# Patient Record
Sex: Female | Born: 1965 | Race: Black or African American | Hispanic: No | Marital: Married | Smoking: Former smoker
Health system: Southern US, Community
[De-identification: ages and names within clinical notes are randomized; demographics above are authoritative.]

## PROBLEM LIST (undated history)

## (undated) ENCOUNTER — Ambulatory Visit (HOSPITAL_COMMUNITY): Admission: EM | Payer: 59 | Source: Home / Self Care

## (undated) DIAGNOSIS — K219 Gastro-esophageal reflux disease without esophagitis: Secondary | ICD-10-CM

## (undated) DIAGNOSIS — E559 Vitamin D deficiency, unspecified: Secondary | ICD-10-CM

## (undated) DIAGNOSIS — U071 COVID-19: Secondary | ICD-10-CM

## (undated) DIAGNOSIS — M5431 Sciatica, right side: Secondary | ICD-10-CM

## (undated) DIAGNOSIS — M5030 Other cervical disc degeneration, unspecified cervical region: Secondary | ICD-10-CM

## (undated) DIAGNOSIS — I10 Essential (primary) hypertension: Secondary | ICD-10-CM

## (undated) DIAGNOSIS — Z789 Other specified health status: Secondary | ICD-10-CM

## (undated) DIAGNOSIS — R7303 Prediabetes: Secondary | ICD-10-CM

## (undated) DIAGNOSIS — E7800 Pure hypercholesterolemia, unspecified: Secondary | ICD-10-CM

## (undated) DIAGNOSIS — D126 Benign neoplasm of colon, unspecified: Secondary | ICD-10-CM

## (undated) HISTORY — DX: COVID-19: U07.1

## (undated) HISTORY — DX: Benign neoplasm of colon, unspecified: D12.6

## (undated) HISTORY — DX: Sciatica, right side: M54.31

## (undated) HISTORY — DX: Vitamin D deficiency, unspecified: E55.9

## (undated) HISTORY — DX: Other cervical disc degeneration, unspecified cervical region: M50.30

## (undated) HISTORY — DX: Gastro-esophageal reflux disease without esophagitis: K21.9

## (undated) HISTORY — DX: Prediabetes: R73.03

## (undated) HISTORY — DX: Essential (primary) hypertension: I10

---

## 1999-04-20 ENCOUNTER — Other Ambulatory Visit: Admission: RE | Admit: 1999-04-20 | Discharge: 1999-04-20 | Payer: Self-pay | Admitting: *Deleted

## 2003-11-19 ENCOUNTER — Ambulatory Visit (HOSPITAL_COMMUNITY): Admission: RE | Admit: 2003-11-19 | Discharge: 2003-11-19 | Payer: Self-pay | Admitting: Family Medicine

## 2005-06-26 ENCOUNTER — Encounter: Admission: RE | Admit: 2005-06-26 | Discharge: 2005-06-26 | Payer: Self-pay

## 2006-11-22 ENCOUNTER — Ambulatory Visit (HOSPITAL_COMMUNITY): Admission: RE | Admit: 2006-11-22 | Discharge: 2006-11-22 | Payer: Self-pay | Admitting: Obstetrics

## 2007-02-27 ENCOUNTER — Emergency Department (HOSPITAL_COMMUNITY): Admission: EM | Admit: 2007-02-27 | Discharge: 2007-02-27 | Payer: Self-pay | Admitting: Family Medicine

## 2007-03-26 ENCOUNTER — Emergency Department (HOSPITAL_COMMUNITY): Admission: EM | Admit: 2007-03-26 | Discharge: 2007-03-26 | Payer: Self-pay | Admitting: Emergency Medicine

## 2008-04-30 ENCOUNTER — Emergency Department (HOSPITAL_COMMUNITY): Admission: EM | Admit: 2008-04-30 | Discharge: 2008-04-30 | Payer: Self-pay | Admitting: Family Medicine

## 2008-08-02 ENCOUNTER — Ambulatory Visit (HOSPITAL_COMMUNITY): Admission: RE | Admit: 2008-08-02 | Discharge: 2008-08-02 | Payer: Self-pay | Admitting: Obstetrics & Gynecology

## 2008-11-02 ENCOUNTER — Emergency Department (HOSPITAL_COMMUNITY): Admission: EM | Admit: 2008-11-02 | Discharge: 2008-11-02 | Payer: Self-pay | Admitting: Family Medicine

## 2009-11-13 ENCOUNTER — Ambulatory Visit (HOSPITAL_COMMUNITY): Admission: RE | Admit: 2009-11-13 | Discharge: 2009-11-13 | Payer: Self-pay | Admitting: Obstetrics & Gynecology

## 2011-01-12 LAB — POCT URINALYSIS DIP (DEVICE)
Glucose, UA: NEGATIVE mg/dL
Hgb urine dipstick: NEGATIVE
Ketones, ur: NEGATIVE mg/dL
Specific Gravity, Urine: 1.02 (ref 1.005–1.030)
Urobilinogen, UA: 0.2 mg/dL (ref 0.0–1.0)

## 2011-01-12 LAB — POCT PREGNANCY, URINE: Preg Test, Ur: NEGATIVE

## 2011-12-29 ENCOUNTER — Other Ambulatory Visit: Payer: Self-pay | Admitting: Obstetrics & Gynecology

## 2011-12-29 DIAGNOSIS — Z1231 Encounter for screening mammogram for malignant neoplasm of breast: Secondary | ICD-10-CM

## 2012-01-24 ENCOUNTER — Ambulatory Visit (HOSPITAL_COMMUNITY)
Admission: RE | Admit: 2012-01-24 | Discharge: 2012-01-24 | Disposition: A | Discharge status: Home / Self Care | Payer: 59 | Source: Ambulatory Visit | Attending: Obstetrics & Gynecology | Admitting: Obstetrics & Gynecology

## 2012-01-24 DIAGNOSIS — Z1231 Encounter for screening mammogram for malignant neoplasm of breast: Secondary | ICD-10-CM

## 2012-06-29 ENCOUNTER — Ambulatory Visit: Payer: 59 | Admitting: Internal Medicine

## 2012-08-14 ENCOUNTER — Ambulatory Visit: Payer: 59 | Admitting: Internal Medicine

## 2012-08-14 DIAGNOSIS — Z0289 Encounter for other administrative examinations: Secondary | ICD-10-CM

## 2012-09-12 ENCOUNTER — Other Ambulatory Visit: Payer: Self-pay | Admitting: Neurosurgery

## 2012-09-14 ENCOUNTER — Encounter (HOSPITAL_COMMUNITY): Payer: Self-pay

## 2012-09-14 ENCOUNTER — Encounter (HOSPITAL_COMMUNITY)
Admission: RE | Admit: 2012-09-14 | Discharge: 2012-09-14 | Disposition: A | Discharge status: Home / Self Care | Payer: 59 | Source: Ambulatory Visit | Attending: Neurosurgery | Admitting: Neurosurgery

## 2012-09-14 HISTORY — DX: Other specified health status: Z78.9

## 2012-09-14 HISTORY — DX: Pure hypercholesterolemia, unspecified: E78.00

## 2012-09-14 LAB — CBC
HCT: 37.3 % (ref 36.0–46.0)
Hemoglobin: 12 g/dL (ref 12.0–15.0)
MCH: 25.3 pg — ABNORMAL LOW (ref 26.0–34.0)
MCV: 78.7 fL (ref 78.0–100.0)
Platelets: 191 10*3/uL (ref 150–400)
RBC: 4.74 MIL/uL (ref 3.87–5.11)
WBC: 4.6 10*3/uL (ref 4.0–10.5)

## 2012-09-14 LAB — SURGICAL PCR SCREEN: Staphylococcus aureus: NEGATIVE

## 2012-09-14 NOTE — Pre-Procedure Instructions (Signed)
20 Cassandra Osborne  09/14/2012   Your procedure is scheduled on:  Monday, December 23  Report to [facility] on the 3rd floor at 1030 AM.  Call this number if you have problems the morning of surgery: [phone]   Remember:   Do not eat food or drink liquids:After Midnight.Sunday night    Take these medicines the morning of surgery with A SIP OF WATER: pain medication  if needed   Do not wear jewelry, make-up or nail polish.  Do not wear lotions, powders, or perfumes. You may wear deodorant.  Do not shave 48 hours prior to surgery.   Do not bring valuables to the hospital.  Contacts, dentures or bridgework may not be worn into surgery.  Leave suitcase in the car. After surgery it may be brought to your room.  For patients admitted to the hospital, checkout time is 11:00 AM the day of discharge.       Special Instructions: Shower using CHG 2 nights before surgery and the night before surgery.  If you shower the day of surgery use CHG.  Use special wash - you have one bottle of CHG for all showers.  You should use approximately 1/3 of the bottle for each shower.   Please read over the following fact sheets that you were given: Pain Booklet, Coughing and Deep Breathing, MRSA Information and Surgical Site Infection Prevention

## 2012-09-17 MED ORDER — CEFAZOLIN SODIUM-DEXTROSE 2-3 GM-% IV SOLR
2.0000 g | INTRAVENOUS | Status: AC
  Administered 2012-09-18: 2 g via INTRAVENOUS
  Filled 2012-09-17: qty 50

## 2012-09-18 ENCOUNTER — Ambulatory Visit (HOSPITAL_COMMUNITY): Payer: 59 | Admitting: Anesthesiology

## 2012-09-18 ENCOUNTER — Encounter (HOSPITAL_COMMUNITY): Payer: Self-pay | Admitting: *Deleted

## 2012-09-18 ENCOUNTER — Encounter (HOSPITAL_COMMUNITY): Payer: Self-pay | Admitting: Anesthesiology

## 2012-09-18 ENCOUNTER — Ambulatory Visit (HOSPITAL_COMMUNITY): Payer: 59

## 2012-09-18 ENCOUNTER — Ambulatory Visit (HOSPITAL_COMMUNITY)
Admission: RE | Admit: 2012-09-18 | Discharge: 2012-09-19 | Disposition: A | Discharge status: Home / Self Care | Payer: 59 | Source: Ambulatory Visit | Attending: Neurosurgery | Admitting: Neurosurgery

## 2012-09-18 ENCOUNTER — Encounter (HOSPITAL_COMMUNITY)
Admission: RE | Disposition: A | Discharge status: Home / Self Care | Payer: Self-pay | Source: Ambulatory Visit | Attending: Neurosurgery

## 2012-09-18 DIAGNOSIS — Z01812 Encounter for preprocedural laboratory examination: Secondary | ICD-10-CM | POA: Insufficient documentation

## 2012-09-18 DIAGNOSIS — M5126 Other intervertebral disc displacement, lumbar region: Secondary | ICD-10-CM | POA: Insufficient documentation

## 2012-09-18 DIAGNOSIS — F17200 Nicotine dependence, unspecified, uncomplicated: Secondary | ICD-10-CM | POA: Insufficient documentation

## 2012-09-18 HISTORY — PX: LUMBAR LAMINECTOMY/DECOMPRESSION MICRODISCECTOMY: SHX5026

## 2012-09-18 SURGERY — LUMBAR LAMINECTOMY/DECOMPRESSION MICRODISCECTOMY 1 LEVEL
Anesthesia: General | Site: Back | Laterality: Left | Wound class: Clean

## 2012-09-18 MED ORDER — PROPOFOL 10 MG/ML IV BOLUS
INTRAVENOUS | Status: DC | PRN
  Administered 2012-09-18: 180 mg via INTRAVENOUS

## 2012-09-18 MED ORDER — THROMBIN 5000 UNITS EX SOLR: CUTANEOUS | Status: DC | PRN

## 2012-09-18 MED ORDER — MORPHINE SULFATE 2 MG/ML IJ SOLN
1.0000 mg | INTRAMUSCULAR | Status: DC | PRN
  Administered 2012-09-18 – 2012-09-19 (×2): 2 mg via INTRAVENOUS
  Filled 2012-09-18 (×2): qty 1

## 2012-09-18 MED ORDER — OXYCODONE HCL 5 MG PO TABS
5.0000 mg | ORAL_TABLET | ORAL | Status: DC | PRN
  Administered 2012-09-18: 10 mg via ORAL
  Administered 2012-09-19: 5 mg via ORAL
  Filled 2012-09-18: qty 1
  Filled 2012-09-18: qty 2

## 2012-09-18 MED ORDER — ONDANSETRON HCL 4 MG/2ML IJ SOLN
INTRAMUSCULAR | Status: DC | PRN
  Administered 2012-09-18: 4 mg via INTRAVENOUS

## 2012-09-18 MED ORDER — MIDAZOLAM HCL 5 MG/5ML IJ SOLN
INTRAMUSCULAR | Status: DC | PRN
  Administered 2012-09-18: 2 mg via INTRAVENOUS

## 2012-09-18 MED ORDER — THROMBIN 5000 UNITS EX KIT
PACK | CUTANEOUS | Status: DC | PRN
  Administered 2012-09-18 (×2): 5000 [IU] via TOPICAL

## 2012-09-18 MED ORDER — HEMOSTATIC AGENTS (NO CHARGE) OPTIME
TOPICAL | Status: DC | PRN
  Administered 2012-09-18: 1 via TOPICAL

## 2012-09-18 MED ORDER — ONDANSETRON HCL 4 MG/2ML IJ SOLN: 4.0000 mg | INTRAMUSCULAR | Status: DC | PRN

## 2012-09-18 MED ORDER — KETOROLAC TROMETHAMINE 30 MG/ML IJ SOLN
INTRAMUSCULAR | Status: AC
  Administered 2012-09-18: 30 mg
  Filled 2012-09-18: qty 1

## 2012-09-18 MED ORDER — SODIUM CHLORIDE 0.9 % IJ SOLN: 3.0000 mL | INTRAMUSCULAR | Status: DC | PRN

## 2012-09-18 MED ORDER — KETOROLAC TROMETHAMINE 30 MG/ML IJ SOLN
30.0000 mg | Freq: Four times a day (QID) | INTRAMUSCULAR | Status: DC
  Administered 2012-09-18 – 2012-09-19 (×2): 30 mg via INTRAVENOUS
  Filled 2012-09-18 (×6): qty 1

## 2012-09-18 MED ORDER — ACETAMINOPHEN 10 MG/ML IV SOLN
1000.0000 mg | Freq: Four times a day (QID) | INTRAVENOUS | Status: DC
  Administered 2012-09-18 – 2012-09-19 (×3): 1000 mg via INTRAVENOUS
  Filled 2012-09-18 (×4): qty 100

## 2012-09-18 MED ORDER — CYCLOBENZAPRINE HCL 10 MG PO TABS: 10.0000 mg | ORAL_TABLET | Freq: Three times a day (TID) | ORAL | Status: DC | PRN

## 2012-09-18 MED ORDER — SODIUM CHLORIDE 0.9 % IJ SOLN
3.0000 mL | Freq: Two times a day (BID) | INTRAMUSCULAR | Status: DC
  Administered 2012-09-18: 3 mL via INTRAVENOUS

## 2012-09-18 MED ORDER — MENTHOL 3 MG MT LOZG
1.0000 | LOZENGE | OROMUCOSAL | Status: DC | PRN
  Administered 2012-09-19: 3 mg via ORAL
  Filled 2012-09-18: qty 9

## 2012-09-18 MED ORDER — OXYCODONE HCL 5 MG/5ML PO SOLN: 5.0000 mg | Freq: Once | ORAL | Status: DC | PRN

## 2012-09-18 MED ORDER — FENTANYL CITRATE 0.05 MG/ML IJ SOLN
INTRAMUSCULAR | Status: DC | PRN
  Administered 2012-09-18 (×2): 50 ug via INTRAVENOUS
  Administered 2012-09-18: 100 ug via INTRAVENOUS
  Administered 2012-09-18: 50 ug via INTRAVENOUS

## 2012-09-18 MED ORDER — OXYCODONE HCL 5 MG PO TABS: 5.0000 mg | ORAL_TABLET | Freq: Once | ORAL | Status: DC | PRN

## 2012-09-18 MED ORDER — LIDOCAINE-EPINEPHRINE 0.5 %-1:200000 IJ SOLN
INTRAMUSCULAR | Status: DC | PRN
  Administered 2012-09-18: 10 mL

## 2012-09-18 MED ORDER — LACTATED RINGERS IV SOLN
INTRAVENOUS | Status: DC | PRN
  Administered 2012-09-18 (×2): via INTRAVENOUS

## 2012-09-18 MED ORDER — NEOSTIGMINE METHYLSULFATE 1 MG/ML IJ SOLN
INTRAMUSCULAR | Status: DC | PRN
  Administered 2012-09-18: 4 mg via INTRAVENOUS

## 2012-09-18 MED ORDER — HYDROMORPHONE HCL PF 1 MG/ML IJ SOLN
INTRAMUSCULAR | Status: AC
  Filled 2012-09-18: qty 1

## 2012-09-18 MED ORDER — POTASSIUM CHLORIDE IN NACL 20-0.9 MEQ/L-% IV SOLN
INTRAVENOUS | Status: DC
  Filled 2012-09-18 (×3): qty 1000

## 2012-09-18 MED ORDER — GLYCOPYRROLATE 0.2 MG/ML IJ SOLN
INTRAMUSCULAR | Status: DC | PRN
  Administered 2012-09-18: .6 mg via INTRAVENOUS

## 2012-09-18 MED ORDER — ROCURONIUM BROMIDE 100 MG/10ML IV SOLN
INTRAVENOUS | Status: DC | PRN
  Administered 2012-09-18: 50 mg via INTRAVENOUS

## 2012-09-18 MED ORDER — ONDANSETRON HCL 4 MG/2ML IJ SOLN: 4.0000 mg | Freq: Four times a day (QID) | INTRAMUSCULAR | Status: DC | PRN

## 2012-09-18 MED ORDER — HYDROMORPHONE HCL PF 1 MG/ML IJ SOLN
0.2500 mg | INTRAMUSCULAR | Status: DC | PRN
  Administered 2012-09-18 (×4): 0.5 mg via INTRAVENOUS

## 2012-09-18 MED ORDER — VITAMIN D (ERGOCALCIFEROL) 1.25 MG (50000 UNIT) PO CAPS
50000.0000 [IU] | ORAL_CAPSULE | ORAL | Status: DC
  Filled 2012-09-18: qty 1

## 2012-09-18 MED ORDER — LIDOCAINE HCL (CARDIAC) 20 MG/ML IV SOLN
INTRAVENOUS | Status: DC | PRN
  Administered 2012-09-18: 80 mg via INTRAVENOUS

## 2012-09-18 MED ORDER — PHENOL 1.4 % MT LIQD: 1.0000 | OROMUCOSAL | Status: DC | PRN

## 2012-09-18 MED ORDER — ARTIFICIAL TEARS OP OINT
TOPICAL_OINTMENT | OPHTHALMIC | Status: DC | PRN
  Administered 2012-09-18: 1 via OPHTHALMIC

## 2012-09-18 SURGICAL SUPPLY — 54 items
BAG DECANTER FOR FLEXI CONT (MISCELLANEOUS) ×2 IMPLANT
BENZOIN TINCTURE PRP APPL 2/3 (GAUZE/BANDAGES/DRESSINGS) IMPLANT
BLADE SURG ROTATE 9660 (MISCELLANEOUS) IMPLANT
BUR MATCHSTICK NEURO 3.0 LAGG (BURR) ×2 IMPLANT
CANISTER SUCTION 2500CC (MISCELLANEOUS) ×2 IMPLANT
CLOTH BEACON ORANGE TIMEOUT ST (SAFETY) ×2 IMPLANT
CONT SPEC 4OZ CLIKSEAL STRL BL (MISCELLANEOUS) ×2 IMPLANT
DECANTER SPIKE VIAL GLASS SM (MISCELLANEOUS) ×2 IMPLANT
DERMABOND ADVANCED (GAUZE/BANDAGES/DRESSINGS) ×1
DERMABOND ADVANCED .7 DNX12 (GAUZE/BANDAGES/DRESSINGS) ×1 IMPLANT
DRAPE LAPAROTOMY 100X72X124 (DRAPES) ×2 IMPLANT
DRAPE MICROSCOPE LEICA (MISCELLANEOUS) ×2 IMPLANT
DRAPE POUCH INSTRU U-SHP 10X18 (DRAPES) ×2 IMPLANT
DRAPE SURG 17X23 STRL (DRAPES) ×2 IMPLANT
DURAPREP 26ML APPLICATOR (WOUND CARE) ×2 IMPLANT
ELECT REM PT RETURN 9FT ADLT (ELECTROSURGICAL) ×2
ELECTRODE REM PT RTRN 9FT ADLT (ELECTROSURGICAL) ×1 IMPLANT
GAUZE SPONGE 4X4 16PLY XRAY LF (GAUZE/BANDAGES/DRESSINGS) IMPLANT
GLOVE BIO SURGEON STRL SZ 6.5 (GLOVE) ×2 IMPLANT
GLOVE BIO SURGEON STRL SZ8 (GLOVE) ×2 IMPLANT
GLOVE BIOGEL PI IND STRL 7.0 (GLOVE) ×1 IMPLANT
GLOVE BIOGEL PI IND STRL 8.5 (GLOVE) ×1 IMPLANT
GLOVE BIOGEL PI INDICATOR 7.0 (GLOVE) ×1
GLOVE BIOGEL PI INDICATOR 8.5 (GLOVE) ×1
GLOVE ECLIPSE 6.5 STRL STRAW (GLOVE) ×2 IMPLANT
GLOVE ECLIPSE 7.5 STRL STRAW (GLOVE) ×6 IMPLANT
GLOVE EXAM NITRILE LRG STRL (GLOVE) IMPLANT
GLOVE EXAM NITRILE MD LF STRL (GLOVE) IMPLANT
GLOVE EXAM NITRILE XL STR (GLOVE) IMPLANT
GLOVE EXAM NITRILE XS STR PU (GLOVE) IMPLANT
GLOVE INDICATOR 8.0 STRL GRN (GLOVE) ×2 IMPLANT
GOWN BRE IMP SLV AUR LG STRL (GOWN DISPOSABLE) ×4 IMPLANT
GOWN BRE IMP SLV AUR XL STRL (GOWN DISPOSABLE) IMPLANT
GOWN STRL REIN 2XL LVL4 (GOWN DISPOSABLE) ×2 IMPLANT
KIT BASIN OR (CUSTOM PROCEDURE TRAY) ×2 IMPLANT
KIT ROOM TURNOVER OR (KITS) ×2 IMPLANT
NEEDLE HYPO 25X1 1.5 SAFETY (NEEDLE) ×2 IMPLANT
NEEDLE SPNL 18GX3.5 QUINCKE PK (NEEDLE) ×2 IMPLANT
NS IRRIG 1000ML POUR BTL (IV SOLUTION) ×2 IMPLANT
PACK LAMINECTOMY NEURO (CUSTOM PROCEDURE TRAY) ×2 IMPLANT
PAD ARMBOARD 7.5X6 YLW CONV (MISCELLANEOUS) ×6 IMPLANT
RUBBERBAND STERILE (MISCELLANEOUS) ×4 IMPLANT
SPONGE GAUZE 4X4 12PLY (GAUZE/BANDAGES/DRESSINGS) IMPLANT
SPONGE LAP 4X18 X RAY DECT (DISPOSABLE) IMPLANT
SPONGE SURGIFOAM ABS GEL SZ50 (HEMOSTASIS) ×2 IMPLANT
STRIP CLOSURE SKIN 1/2X4 (GAUZE/BANDAGES/DRESSINGS) IMPLANT
SUT VIC AB 0 CT1 18XCR BRD8 (SUTURE) ×1 IMPLANT
SUT VIC AB 0 CT1 8-18 (SUTURE) ×1
SUT VIC AB 2-0 CT1 18 (SUTURE) ×2 IMPLANT
SUT VIC AB 3-0 SH 8-18 (SUTURE) ×2 IMPLANT
SYR 20ML ECCENTRIC (SYRINGE) ×2 IMPLANT
TOWEL OR 17X24 6PK STRL BLUE (TOWEL DISPOSABLE) ×2 IMPLANT
TOWEL OR 17X26 10 PK STRL BLUE (TOWEL DISPOSABLE) ×2 IMPLANT
WATER STERILE IRR 1000ML POUR (IV SOLUTION) ×2 IMPLANT

## 2012-09-18 NOTE — Anesthesia Preprocedure Evaluation (Signed)
Anesthesia Evaluation  Patient identified by MRN, date of birth, ID band Patient awake    Reviewed: Allergy & Precautions, H&P , NPO status , Patient's Chart, lab work & pertinent test results  Airway Mallampati: II  Neck ROM: full    Dental   Pulmonary Current Smoker,          Cardiovascular     Neuro/Psych    GI/Hepatic   Endo/Other    Renal/GU      Musculoskeletal   Abdominal   Peds  Hematology   Anesthesia Other Findings   Reproductive/Obstetrics                           Anesthesia Physical Anesthesia Plan  ASA: II  Anesthesia Plan: General   Post-op Pain Management:    Induction: Intravenous  Airway Management Planned: Oral ETT  Additional Equipment:   Intra-op Plan:   Post-operative Plan: Extubation in OR  Informed Consent: I have reviewed the patients History and Physical, chart, labs and discussed the procedure including the risks, benefits and alternatives for the proposed anesthesia with the patient or authorized representative who has indicated his/her understanding and acceptance.     Plan Discussed with: CRNA and Surgeon  Anesthesia Plan Comments:         Anesthesia Quick Evaluation

## 2012-09-18 NOTE — Discharge Summary (Signed)
Physician Discharge Summary  Patient ID: Cassandra Osborne MRN: [id number] DOB/AGE: 48-Mar-1967 48 y.o.  Admit date: 09/18/2012 Discharge date: 09/18/2012  Admission Diagnoses:Herniated Lumbar disc L4/5 left  Discharge Diagnoses: Herniated Lumbar disc L4/5 left Active Problems:  * No active hospital problems. *    Discharged Condition: good  Hospital Course: Cassandra Osborne was admitted and taken to the operating room where she underwent a simple discetomy at L4/5. Post op she has done very well, walking, voiding, and tolerating a regular diet. Her wound is clean, dry, and without signs of infection.   Consults: None  Significant Diagnostic Studies: none  Treatments: surgery: Left L4/5 laminectomy and discetomy  Discharge Exam: Blood pressure 138/75, pulse 49, temperature 98.4 F (36.9 C), temperature source Oral, resp. rate 16, last menstrual period 08/18/2012, SpO2 100.00%. General appearance: alert, cooperative, appears stated age and no distress Neurologic: Alert and oriented X 3, normal strength and tone. Normal symmetric reflexes. Normal coordination and gait  Disposition: Final discharge disposition not confirmed     Medication List     As of 09/18/2012  8:23 PM    TAKE these medications         cyclobenzaprine 10 MG tablet   Commonly known as: FLEXERIL   Take 1 tablet (10 mg total) by mouth 3 (three) times daily as needed for muscle spasms.      HYDROcodone-acetaminophen 5-325 MG per tablet   Commonly known as: NORCO/VICODIN   Take 1 tablet by mouth every 6 (six) hours as needed. For pain      ibuprofen 800 MG tablet   Commonly known as: ADVIL,MOTRIN   Take 800 mg by mouth every 8 (eight) hours as needed. For pain      Vitamin D (Ergocalciferol) 50000 UNITS Caps   Commonly known as: DRISDOL   Take 50,000 Units by mouth every 7 (seven) days. Sunday           Follow-up Information    Follow up with Deondrick Searls L, MD. In 3 weeks. (call to make  appointment)    Contact information:   [address], [address]                         [address] [place] [address] [phone]          Signed: Laraya Pestka L 09/18/2012, 8:23 PM

## 2012-09-18 NOTE — Anesthesia Postprocedure Evaluation (Signed)
Anesthesia Post Note  Patient: Cassandra Osborne  Procedure(s) Performed: Procedure(s) (LRB): LUMBAR LAMINECTOMY/DECOMPRESSION MICRODISCECTOMY 1 LEVEL (Left)  Anesthesia type: General  Patient location: PACU  Post pain: Pain level controlled and Adequate analgesia  Post assessment: Post-op Vital signs reviewed, Patient's Cardiovascular Status Stable, Respiratory Function Stable, Patent Airway and Pain level controlled  Last Vitals:  Filed Vitals:   09/18/12 1616  BP: 143/63  Pulse: 58  Temp:   Resp: 16    Post vital signs: Reviewed and stable  Level of consciousness: awake, alert  and oriented  Complications: No apparent anesthesia complications

## 2012-09-18 NOTE — Op Note (Signed)
09/18/2012  4:08 PM  PATIENT:  Cassandra Osborne  46 y.o. female With progressive unrelenting pain in the left lower extremity unrelieved by conservative treatment. 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 Ng was brought to the operating room intubated and placed under a general anesthetic without difficulty. She was positioned prone on a Wilson frame with all pressure points properly padded. Her back was prepped and she was draped in a sterile fashion with DuraPrep. I infiltrated 10 cc half percent lidocaine into the lumbar region. I opened the skin with a #10 blade and took the incision down to the thoracolumbar fascia. I exposed the lamina of L4 and L5 on the left side. I confirmed my position with an intraoperative x-ray. I proceeded with a semi-hemilaminectomy of L4 using a drill and Kerrison punches. I remove the ligamentum flavum and expose the thecal sac on the left side I retracted the thecal sac medially with the use of microdissection. I opened the disc space with a #15 blade and proceeded with the discectomy.  I decompressed the left L5 nerve root with Dr. Lezlie Lye assistance. We removed disc material from the midline. The disc space itself was quite degenerated. A good deal of disc was removed. At the end of the decompression the nerve root was inspected and the rostral medial lateral and caudal directions. I do not believe that there are any fragments of disc remaining nor did I believe  there is any more disc to be removed. I did use a Kerrison punch along the midline to remove some of the disc material along with Epstein curettes and pituitary rongeurs of different sizes and configurations. I irrigated the wound. We closed the wound in layered fashion reapproximated the thoracolumbar subcutaneous and subcuticular layers. I used Dermabond for a sterile dressing.  PLAN OF CARE: Admit for overnight observation  PATIENT DISPOSITION:  PACU - hemodynamically stable.   Delay start of Pharmacological VTE agent (>24hrs) due to surgical blood loss or risk of bleeding:  yes

## 2012-09-18 NOTE — Transfer of Care (Signed)
Immediate Anesthesia Transfer of Care Note  Patient: Cassandra Osborne  Procedure(s) Performed: Procedure(s) (LRB) with comments: LUMBAR LAMINECTOMY/DECOMPRESSION MICRODISCECTOMY 1 LEVEL (Left) - Left Lumbar Four-Five Diskectomy  Patient Location: PACU  Anesthesia Type:General  Level of Consciousness: awake, sedated and patient cooperative  Airway & Oxygen Therapy: Patient Spontanous Breathing and Patient connected to nasal cannula oxygen  Post-op Assessment: Report given to PACU RN, Post -op Vital signs reviewed and stable and Patient moving all extremities X 4  Post vital signs: Reviewed and stable  Complications: No apparent anesthesia complications

## 2012-09-18 NOTE — Plan of Care (Signed)
Problem: Consults Goal: Diagnosis - Spinal Surgery Outcome: Completed/Met Date Met:  09/18/12 Microdiscectomy     

## 2012-09-18 NOTE — H&P (Signed)
Marland KitchenMarland KitchenMarland Kitchen..........BP 165/84  Pulse 55  Temp 98 F (36.7 C) (Oral)  Resp 20  SpO2 100%  LMP 08/18/2012  HISTORY OF PRESENT ILLNESS:  Brooklyn Alfredo comes in today for evaluation of pain that she has in her back and left lower extremity.  She has had this pain since June of this year. She said she noticed it when driving.  She said it is very for her to sleep, very painful for her to walk or sit. Not much has relieved the pain.  She has never had pain like this in the past.  She has no pain on the right side.  She feels it in the left buttock and lower extremity.  She has had two injections which have not helped.  She has taken pain pills.  She feels the pain throughout the day.  She has missed work secondary to the pain on maybe one occasion.  She has had no bowel or bladder dysfunction.    PAST MEDICAL HISTORY:  Good outside hypercholesterolemia.    SOCIAL HISTORY:    She is 46 years of age, right handed and works as a Conservation officer, nature.  She does not use alcohol.  She does not use illicit drugs.  She reports she is 5'6" tall and weighing 188 pounds; however, on measurement she is 167 cm. in height and weighs 180 pounds and has a pulse of 56.  FAMILY HISTORY:    Mother is 24, father is 22.  Both are in good health. Hip replacement, stroke, diabetes in the family history.   PAST SURGICAL HISTORY:  No previous operations.    ALLERGIES:    SULFUR CONTAINING MEDICATIONS.    REVIEW OF SYSTEMS:   Positive for night sweats, eye glasses, nose bleeds, sinus problems, hypercholesterolemia, leg weakness, leg pain, arthritis and anxiety.  She denies respiratory, GI, GU, skin, neurological, endocrine, hematologic and allergic problems.  She is currently taking Ibuprofen and Hydrocodone.    PHYSICAL EXAMINATION:  On examination, she is alert and oriented x four and answering all questions appropriately.  Memory, language, attention span, and fund of knowledge are normal.  She is well kempt and in minor distress.  Mildly  antalgic gait. She can toe walk and heel walk without difficulty.  PERRL.  Full EOM's.  Full visual fields.  Hearing is intact to finger rub bilaterally.  Uvula elevates in the midline.  Shoulder shrug is normal.  Tongue   protrudes in the midline.  5/5 strength in both upper extremities.  Normal strength in the lower extremities.  Muscle tone, bulk and coordination is normal.  Proprioception is intact.  Reflexes are 2+ in the biceps, triceps, brachioradialis, knees and ankles.  Downgoing toes to plantar stimulation.  Proprioception is intact in the upper and lower extremities. No cervical masses or bruits.  Lung fields are clear.  Heart: regular rate and rhythm.  No murmurs or rubs.  Pulses are good at the wrists bilaterally.  Sclerae are not injected.  Oral mucosa is normal. Head is normocephalic and atraumatic.  DIAGNOSTIC STUDIES:   MRI shows a herniated disc on the left side at L4-5.  It is midline to slightly eccentric towards the left.  Right side is not affected by this at all.  The left-sided root is compromised.  The conus is otherwise normal.  The cauda equina is normal.  Paraspinous soft tissue appears to be normal.  SUMMARY:     Mrs. Lasorsa has had this disc herniation now since June.  Time has  not been able to relieve the pain and injections, conservative treatment, therapy have not been helpful.  I have offered and she has agreed to undergo operative decompression.  Risks and benefits, bleeding, infection, no relief, need for further surgery, disc recurrence, bowel, bladder dysfunction, weakness in one or both of the lower extremities and other risks discussed. She understands and wishes to proceed.  I do anticipate she will be better though you can never guarantee it.

## 2012-09-19 ENCOUNTER — Encounter (HOSPITAL_COMMUNITY): Payer: Self-pay | Admitting: Neurosurgery

## 2013-02-26 ENCOUNTER — Encounter: Payer: Self-pay | Admitting: Obstetrics & Gynecology

## 2013-02-26 ENCOUNTER — Ambulatory Visit (INDEPENDENT_AMBULATORY_CARE_PROVIDER_SITE_OTHER): Payer: 59 | Admitting: Obstetrics & Gynecology

## 2013-02-26 VITALS — BP 129/75 | HR 63 | Temp 98.4°F | Wt 179.0 lb

## 2013-02-26 DIAGNOSIS — N926 Irregular menstruation, unspecified: Secondary | ICD-10-CM

## 2013-02-26 DIAGNOSIS — N939 Abnormal uterine and vaginal bleeding, unspecified: Secondary | ICD-10-CM | POA: Insufficient documentation

## 2013-02-26 MED ORDER — NORETHINDRONE ACETATE 5 MG PO TABS: 10.0000 mg | ORAL_TABLET | Freq: Every day | ORAL | Status: DC

## 2013-02-26 NOTE — Patient Instructions (Addendum)

## 2013-02-26 NOTE — Progress Notes (Signed)
Subjective:    Cassandra Osborne is a 47 y.o. female who presents for evaluation of menstrual symptoms. Symptoms began 12 days ago. Patient describes symptoms of menorrhagia (mild). Symptoms occur for the last 12 days. Patient denies anxiety, bloating/fluid retention, decreased libido, depression, dyspareunia, insomnia, migraine headaches and pelvic pain. Evaluation to date includes: none. Treatment to date includes: nothing. The patient is sexually active.     Menarche age: 30  Patient's last menstrual period was 02/14/2013.    The following portions of the patient's history were reviewed and updated as appropriate: allergies, current medications, past family history, past medical history, past social history, past surgical history and problem list.  Review of Systems Pertinent items are noted in HPI.   Objective:    BP 129/75  Pulse 63  Temp(Src) 98.4 F (36.9 C) (Oral)  Wt 179 lb (81.194 kg)  BMI 28.91 kg/m2  LMP 02/14/2013  General appearance: alert  Abdomen: soft, non-tender; bowel sounds normal; no masses,  no organomegaly Pelvic: cervix normal in appearance, external genitalia normal, no adnexal masses or tenderness, uterus normal size, shape, and consistency and vagina with < 1 cm, sidewall, right-sided cyst, menstrual blood present  Assessment:   AUB--O  Plan:   Aygestin x 1 week Pelvic U/S

## 2013-03-21 ENCOUNTER — Ambulatory Visit: Payer: 59 | Admitting: Obstetrics & Gynecology

## 2013-07-17 ENCOUNTER — Other Ambulatory Visit: Payer: Self-pay | Admitting: Obstetrics & Gynecology

## 2013-07-17 DIAGNOSIS — N926 Irregular menstruation, unspecified: Secondary | ICD-10-CM

## 2013-07-18 ENCOUNTER — Ambulatory Visit (INDEPENDENT_AMBULATORY_CARE_PROVIDER_SITE_OTHER): Payer: 59

## 2013-07-18 DIAGNOSIS — N926 Irregular menstruation, unspecified: Secondary | ICD-10-CM

## 2013-08-07 ENCOUNTER — Encounter: Payer: Self-pay | Admitting: Obstetrics & Gynecology

## 2013-08-07 ENCOUNTER — Other Ambulatory Visit (HOSPITAL_COMMUNITY): Payer: Self-pay | Admitting: Family Medicine

## 2013-08-07 DIAGNOSIS — Z1231 Encounter for screening mammogram for malignant neoplasm of breast: Secondary | ICD-10-CM

## 2013-08-10 ENCOUNTER — Ambulatory Visit (HOSPITAL_COMMUNITY): Payer: 59

## 2013-08-27 ENCOUNTER — Ambulatory Visit (HOSPITAL_COMMUNITY): Payer: 59

## 2013-09-11 ENCOUNTER — Ambulatory Visit (HOSPITAL_COMMUNITY)
Admission: RE | Admit: 2013-09-11 | Discharge: 2013-09-11 | Disposition: A | Discharge status: Home / Self Care | Payer: 59 | Source: Ambulatory Visit | Attending: Family Medicine | Admitting: Family Medicine

## 2013-09-11 DIAGNOSIS — Z1231 Encounter for screening mammogram for malignant neoplasm of breast: Secondary | ICD-10-CM

## 2013-12-27 ENCOUNTER — Ambulatory Visit
Admission: RE | Admit: 2013-12-27 | Discharge: 2013-12-27 | Disposition: A | Discharge status: Home / Self Care | Payer: 59 | Source: Ambulatory Visit | Attending: Family Medicine | Admitting: Family Medicine

## 2013-12-27 ENCOUNTER — Other Ambulatory Visit: Payer: Self-pay | Admitting: Family Medicine

## 2013-12-27 DIAGNOSIS — R109 Unspecified abdominal pain: Secondary | ICD-10-CM

## 2014-06-12 ENCOUNTER — Other Ambulatory Visit: Payer: Self-pay | Admitting: Obstetrics & Gynecology

## 2014-06-12 DIAGNOSIS — Z1231 Encounter for screening mammogram for malignant neoplasm of breast: Secondary | ICD-10-CM

## 2014-07-01 ENCOUNTER — Ambulatory Visit (INDEPENDENT_AMBULATORY_CARE_PROVIDER_SITE_OTHER): Payer: 59 | Admitting: Obstetrics & Gynecology

## 2014-07-01 ENCOUNTER — Encounter: Payer: Self-pay | Admitting: Obstetrics & Gynecology

## 2014-07-01 VITALS — BP 139/87 | HR 49 | Temp 98.0°F | Ht 67.0 in | Wt 191.4 lb

## 2014-07-01 DIAGNOSIS — Z01419 Encounter for gynecological examination (general) (routine) without abnormal findings: Secondary | ICD-10-CM

## 2014-07-01 DIAGNOSIS — N951 Menopausal and female climacteric states: Secondary | ICD-10-CM | POA: Insufficient documentation

## 2014-07-01 MED ORDER — ESTRADIOL-LEVONORGESTREL 0.045-0.015 MG/DAY TD PTWK: 1.0000 | MEDICATED_PATCH | TRANSDERMAL | Status: DC

## 2014-07-01 NOTE — Patient Instructions (Signed)

## 2014-07-01 NOTE — Progress Notes (Signed)
Subjective:     Cassandra Osborne is a 48 y.o. female here for a routine exam.    Personal health questionnaire:  Is patient [name], have a family history of breast and/or ovarian cancer: no Is there a family history of uterine cancer diagnosed at age < 70, gastrointestinal cancer, urinary tract cancer, family member who is a Field seismologist syndrome-associated carrier: no Is the patient overweight and hypertensive, family history of diabetes, personal history of gestational diabetes or PCOS: yes Is patient over 58, have PCOS,  family history of premature CHD under age 59, diabetes, smoke, have hypertension or peripheral artery disease:  no At any time, has a partner hit, kicked or otherwise hurt or frightened you?: no Over the past 2 weeks, have you felt down, depressed or hopeless?: no Over the past 2 weeks, have you felt little interest or pleasure in doing things?:no   Gynecologic History No LMP recorded. Patient is not currently having periods (Reason: Perimenopausal). Contraception: condoms Last Pap results were: normal Last mammogram: wnl. Results were: normal  Obstetric History OB History  Gravida Para Term Preterm AB SAB TAB Ectopic Multiple Living  2 2 2       2     # Outcome Date GA Lbr Len/2nd Weight Sex Delivery Anes PTL Lv  2 TRM      SVD None  Y  1 TRM      SVD EPI  Y      Past Medical History  Diagnosis Date  . High cholesterol   . No pertinent past medical history     Past Surgical History  Procedure Laterality Date  . Lumbar laminectomy/decompression microdiscectomy  09/18/2012    Procedure: LUMBAR LAMINECTOMY/DECOMPRESSION MICRODISCECTOMY 1 LEVEL;  Surgeon: Winfield Cunas, MD;  Location: [facility];  Service: Neurosurgery;  Laterality: Left;  Left Lumbar Four-Five Diskectomy    Current outpatient prescriptions:Vitamin D, Ergocalciferol, (DRISDOL) 50000 UNITS CAPS, Take 50,000 Units by mouth every 7 (seven) days. Sunday, Disp: , Rfl: ;  ibuprofen  (ADVIL,MOTRIN) 800 MG tablet, Take 800 mg by mouth every 8 (eight) hours as needed. For pain, Disp: , Rfl: ;  norethindrone (AYGESTIN) 5 MG tablet, Take 2 tablets (10 mg total) by mouth daily., Disp: 20 tablet, Rfl: 0 Allergies  Allergen Reactions  . Septra [Sulfamethoxazole-Trimethoprim] Hives    History  Substance Use Topics  . Smoking status: Former Smoker    Types: Cigarettes  . Smokeless tobacco: Never Used     Comment:    . Alcohol Use: No    Family History  Problem Relation Age of Onset  . Stroke Father   . Diabetes Father   . Hyperlipidemia Father   . Hypertension Mother   . Cancer    . Breast cancer Maternal Aunt       Review of Systems  Constitutional: negative for fatigue and weight loss Respiratory: negative for cough and wheezing Cardiovascular: negative for chest pain, fatigue and palpitations Gastrointestinal: negative for abdominal pain and change in bowel habits Musculoskeletal:negative for myalgias Neurological: negative for gait problems and tremors Behavioral/Psych: negative for abusive relationship, depression Endocrine: negative for temperature intolerance   Genitourinary: positive for irregular menstrual periods, hot flashes, sexual problems and vaginal discharge Integument/breast: negative for breast lump, breast tenderness, nipple discharge and skin lesion(s)    Objective:       BP 139/87  Pulse 49  Temp(Src) 98 F (36.7 C)  Ht 5\' 7"  (1.702 m)  Wt 86.818 kg (191 lb 6.4 oz)  BMI 29.97 kg/m2 General:   alert  Skin:   no rash or abnormalities  Lungs:   clear to auscultation bilaterally  Heart:   regular rate and rhythm, S1, S2 normal, no murmur, click, rub or gallop  Breasts:   normal without suspicious masses, skin or nipple changes or axillary nodes  Abdomen:  normal findings: no organomegaly, soft, non-tender and no hernia  Pelvis:  External genitalia: normal general appearance Urinary system: urethral meatus normal and bladder without  fullness, nontender Vaginal: normal without tenderness, induration or masses Cervix: normal appearance Adnexa: normal bimanual exam Uterus: anteverted and non-tender, normal size   Lab Review Urine pregnancy test Labs reviewed yes Radiologic studies reviewed yes    Assessment:    Healthy female exam.   Perimenopausal ?Vulvovaginitis Plan:    Education reviewed: calcium supplements, low fat, low cholesterol diet and weight bearing exercise. Hormone replacement therapy: hormone replacement therapy: Climara/pro and risks and benefits reviewed.  Orders Placed This Encounter  Procedures  . WET PREP BY MOLECULAR PROBE  . POCT Wet Prep [facility])   Meds ordered this encounter  Medications  . estradiol-levonorgestrel (CLIMARA PRO) 0.045-0.015 MG/DAY    Sig: Place 1 patch onto the skin once a week.    Dispense:  8 patch    Refill:  3   Possible management options include: Symptomatic/lifestyle modifications Follow up in 3 mths

## 2014-07-02 LAB — WET PREP BY MOLECULAR PROBE
Candida species: NEGATIVE
GARDNERELLA VAGINALIS: POSITIVE — AB
TRICHOMONAS VAG: NEGATIVE

## 2014-07-24 ENCOUNTER — Ambulatory Visit: Payer: 59 | Admitting: Obstetrics & Gynecology

## 2014-07-29 ENCOUNTER — Encounter: Payer: Self-pay | Admitting: Obstetrics & Gynecology

## 2014-08-07 ENCOUNTER — Other Ambulatory Visit: Payer: Self-pay | Admitting: *Deleted

## 2014-08-07 DIAGNOSIS — N760 Acute vaginitis: Principal | ICD-10-CM

## 2014-08-07 DIAGNOSIS — B9689 Other specified bacterial agents as the cause of diseases classified elsewhere: Secondary | ICD-10-CM

## 2014-08-07 MED ORDER — METRONIDAZOLE 500 MG PO TABS: 500.0000 mg | ORAL_TABLET | Freq: Two times a day (BID) | ORAL | Status: DC

## 2014-09-16 ENCOUNTER — Ambulatory Visit (HOSPITAL_COMMUNITY)
Admission: RE | Admit: 2014-09-16 | Discharge: 2014-09-16 | Disposition: A | Discharge status: Home / Self Care | Payer: 59 | Source: Ambulatory Visit | Attending: Obstetrics & Gynecology | Admitting: Obstetrics & Gynecology

## 2014-09-16 DIAGNOSIS — Z1231 Encounter for screening mammogram for malignant neoplasm of breast: Secondary | ICD-10-CM | POA: Insufficient documentation

## 2014-09-23 ENCOUNTER — Encounter: Payer: Self-pay | Admitting: *Deleted

## 2014-09-24 ENCOUNTER — Encounter: Payer: Self-pay | Admitting: Obstetrics & Gynecology

## 2014-10-20 IMAGING — CR DG LUMBAR SPINE 2-3V
1 series · 1 of 1 positions shown · non-contrast
Comparison: None.

CLINICAL DATA: Left L4-5 discectomy

LUMBAR SPINE - 2-3 VIEW

[view not recorded]
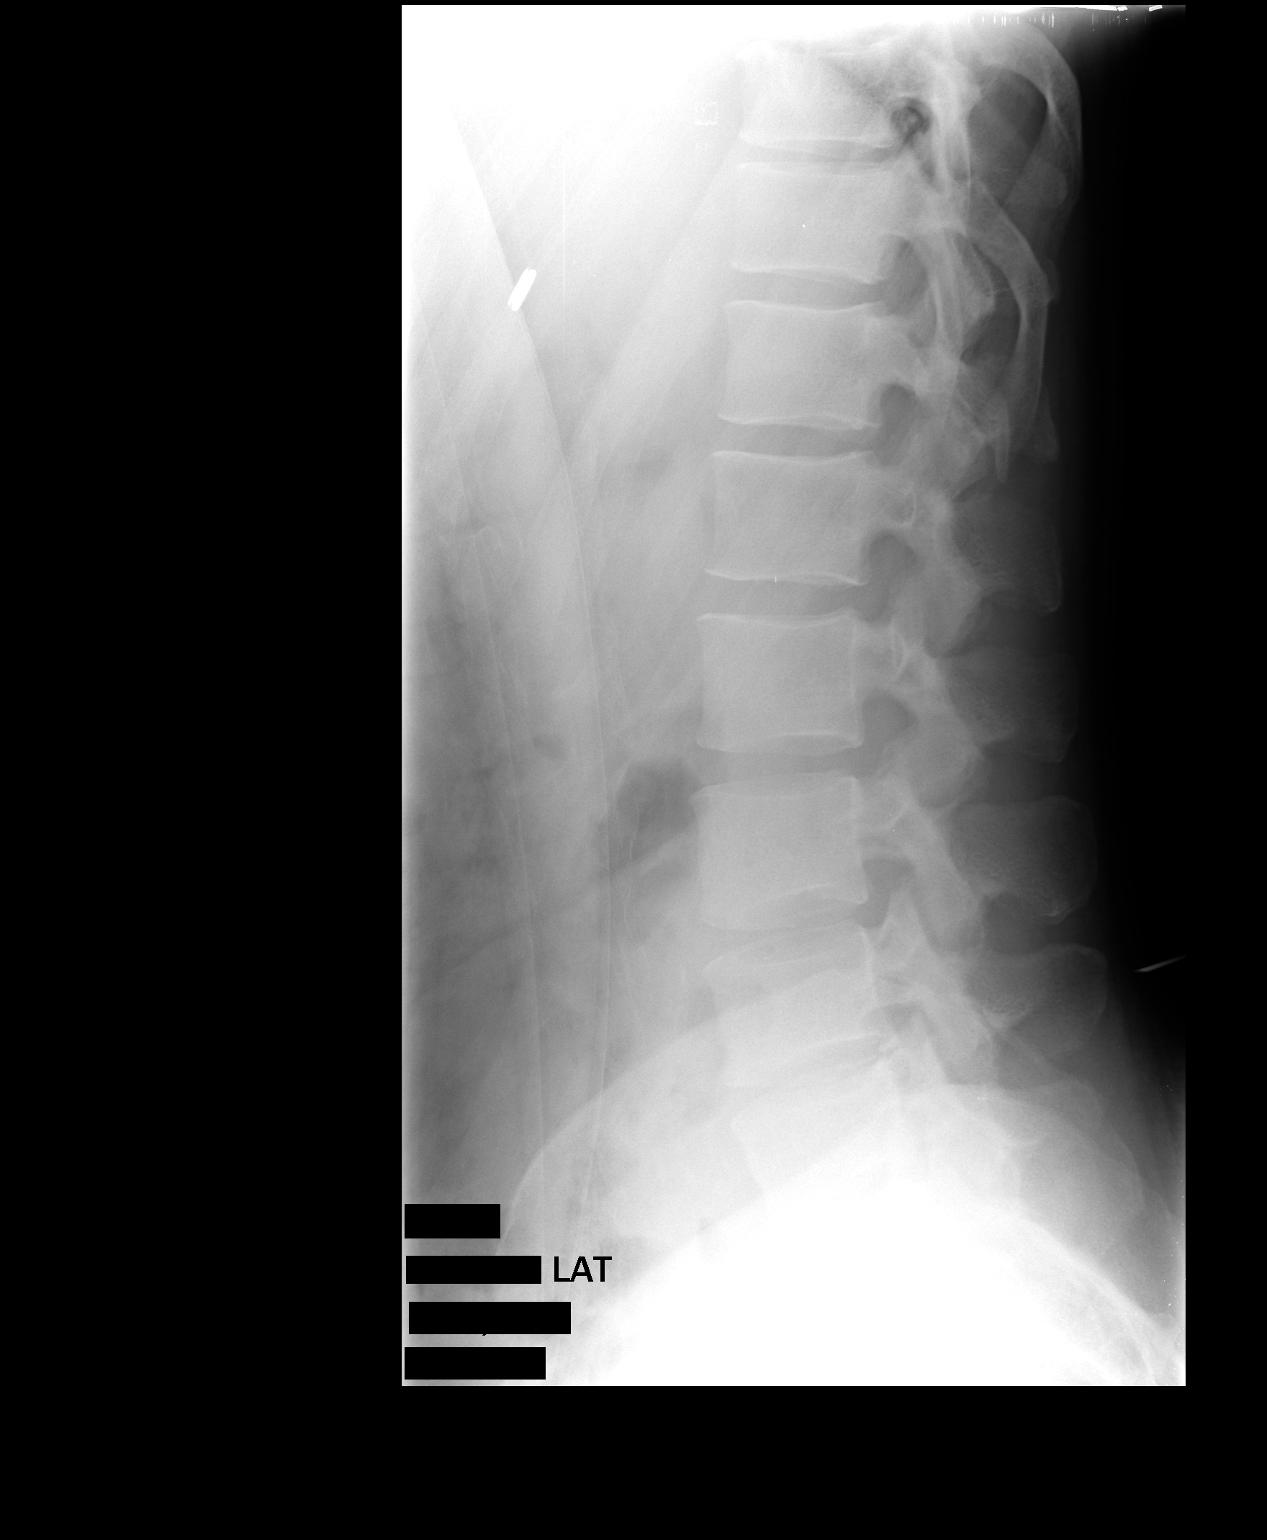

[1 of 1 positions shown; findings below may reference images not displayed]

FINDINGS: Initial lateral radiograph demonstrates a surgical probe
posterior to the L4 posterior spinous process.

Second lateral radiograph demonstrates surgical hardware L4-5.
IMPRESSION: Lumbar radiographs during left L4-5 discectomy, as described above.

## 2014-10-28 ENCOUNTER — Ambulatory Visit: Payer: Self-pay

## 2014-10-28 ENCOUNTER — Other Ambulatory Visit: Payer: Self-pay | Admitting: Occupational Medicine

## 2014-10-28 DIAGNOSIS — M25532 Pain in left wrist: Secondary | ICD-10-CM

## 2015-09-10 ENCOUNTER — Other Ambulatory Visit: Payer: Self-pay

## 2015-09-10 DIAGNOSIS — Z1231 Encounter for screening mammogram for malignant neoplasm of breast: Secondary | ICD-10-CM

## 2015-09-30 ENCOUNTER — Ambulatory Visit
Admission: RE | Admit: 2015-09-30 | Discharge: 2015-09-30 | Disposition: A | Discharge status: Home / Self Care | Payer: BC Managed Care – PPO | Source: Ambulatory Visit

## 2015-09-30 DIAGNOSIS — Z1231 Encounter for screening mammogram for malignant neoplasm of breast: Secondary | ICD-10-CM

## 2015-10-22 ENCOUNTER — Encounter: Payer: Self-pay | Admitting: Obstetrics and Gynecology

## 2015-12-19 ENCOUNTER — Encounter: Payer: Self-pay | Admitting: Internal Medicine

## 2015-12-19 ENCOUNTER — Ambulatory Visit (INDEPENDENT_AMBULATORY_CARE_PROVIDER_SITE_OTHER): Payer: BC Managed Care – PPO | Admitting: Internal Medicine

## 2015-12-19 ENCOUNTER — Encounter: Payer: Self-pay | Admitting: Gastroenterology

## 2015-12-19 VITALS — BP 144/90 | HR 90 | Temp 97.9°F | Ht 67.0 in | Wt 187.0 lb

## 2015-12-19 DIAGNOSIS — E1121 Type 2 diabetes mellitus with diabetic nephropathy: Secondary | ICD-10-CM | POA: Diagnosis not present

## 2015-12-19 DIAGNOSIS — Z794 Long term (current) use of insulin: Secondary | ICD-10-CM

## 2015-12-19 DIAGNOSIS — R0789 Other chest pain: Secondary | ICD-10-CM | POA: Insufficient documentation

## 2015-12-19 DIAGNOSIS — E785 Hyperlipidemia, unspecified: Secondary | ICD-10-CM

## 2015-12-19 DIAGNOSIS — R7303 Prediabetes: Secondary | ICD-10-CM | POA: Insufficient documentation

## 2015-12-19 DIAGNOSIS — Z0000 Encounter for general adult medical examination without abnormal findings: Secondary | ICD-10-CM | POA: Diagnosis not present

## 2015-12-19 DIAGNOSIS — E119 Type 2 diabetes mellitus without complications: Secondary | ICD-10-CM | POA: Insufficient documentation

## 2015-12-19 DIAGNOSIS — R1013 Epigastric pain: Secondary | ICD-10-CM | POA: Diagnosis not present

## 2015-12-19 DIAGNOSIS — R42 Dizziness and giddiness: Secondary | ICD-10-CM | POA: Insufficient documentation

## 2015-12-19 LAB — POCT GLYCOSYLATED HEMOGLOBIN (HGB A1C): HEMOGLOBIN A1C: 5.9

## 2015-12-19 NOTE — Assessment & Plan Note (Signed)
Pt was told by her last PCP that she has diabetes. We have no A1c on file. Pt takes no medications and does not check her blood sugar at home. - HgbA1c performed in clinic today was 5.9%, consistent with prediabetes - Will encourage lifestyle modifications - Follow-up in 3 months

## 2015-12-19 NOTE — Assessment & Plan Note (Signed)
Last mammogram in 2016, which was normal. Has been many years since her last pap. Has never had a colonoscopy - Advised Pt to return for pap - Referral placed to GI for colonoscopy

## 2015-12-19 NOTE — Assessment & Plan Note (Signed)
Pt having pain underneath her left breast. Feels like a "catch". Used to occur occasionally, but now occurring daily. Not associated with exertion or eating. On exam, she has tenderness to palpation of that area. Likely musculoskeletal. Low suspicion for ACS. - Will continue to monitor - Follow-up if not improved

## 2015-12-19 NOTE — Patient Instructions (Signed)
It was so nice to meet you!  I have ordered a Hemoglobin A1c and a Lipid Panel. I will send you a letter with these results.  I have also put in a referral for a colonoscopy. Our office will call you in 2 weeks to schedule this appointment.  I have also ordered an abdominal ultrasound. I will call you with these results.  Please come back to have a Pap Smear done in the next month.  -Dr. Brett Albino

## 2015-12-19 NOTE — Assessment & Plan Note (Signed)
Pt has been "feeling off balance" for 1 month. Started occurring after she got a new prescription for her glasses. Worse when looking at her computer or when she is moving around. Likely related to her vision. - Advised Pt to make a follow-up appointment with her eye doctor to rule out vision as a cause - Follow-up if not improved

## 2015-12-19 NOTE — Assessment & Plan Note (Signed)
Pt having severe episodic epigastric/RUQ abdominal pain that is worse after eating. Also having some pain that radiates to her back. Differentials include cholelithiasis, gastritis, mild pancreatitis, GERD, indigestion. - Given that she is an obese female in her 90s, will order abdominal ultrasound to rule out gallstones - May need to consider starting her on a PPI - Will call her to discuss the results of the ultrasound

## 2015-12-19 NOTE — Progress Notes (Signed)
[facility] Phone: [phone]  Subjective:  Pt is here to establish care and to meet her new PCP.   -Pain under left breast: Has been going on for a year. Feels like a "catch".  Used to be occasional, but now it happens every day. The pain occurs when she is sitting or when she bends over. The pain gets better if she shakes it out. She has not tried any medications. The pain lasts for 5 seconds. The pain is not related to exercise or eating.    -Dizziness: This has been going on for a month. She describes the dizziness as "feeling off balance". The dizziness occurs when she is standing up at work looking at her computer. The dizziness also occurs when she works out. She has associated nausea. The dizziness lasts for a couple of minutes. No palpitations. No chest pain. No shortness of breath. She thinks she needs to get her eyes checked because this started occurring after she got new glasses. No muscle weakness. No falls.   -Epigastric/RUQ abdominal pain: Pt states the pain is severe and episodic. Has been going on for ~1 month. Pain is worse after eating. Pain radiates to the middle of her back. +Occasional nausea, no vomiting. No fevers, no chills.   -Diabetes: Pt was told by her former PCP that she has diabetes. She has never checked her blood sugar and does not take any medications for this.    -Hyperlipidemia: Pt was told by her former PCP that she has high cholesterol. She has never taken any medications for this.   -Health Maintenance: Last mammogram in 2016. No history of abnormal mammogram. States it has been "many years" since her last pap. No history of abnormal paps. Has never had a colonoscopy.  ROS: See HPI for pertinent positives and negatives  Past Medical History- Diabetes, HLD, iron deficiency  Past Surgical History- Back surgery in 2013  Reviewed problem list.  Medications- reviewed and updated Current Outpatient Prescriptions  Medication Sig  Dispense Refill  . ferrous sulfate 325 (65 FE) MG tablet Take 325 mg by mouth daily with breakfast.    . estradiol-levonorgestrel (CLIMARA PRO) 0.045-0.015 MG/DAY Place 1 patch onto the skin once a week. (Patient not taking: Reported on 12/19/2015) 8 patch 3  . ibuprofen (ADVIL,MOTRIN) 800 MG tablet Take 800 mg by mouth every 8 (eight) hours as needed. Reported on 12/19/2015    . metroNIDAZOLE (FLAGYL) 500 MG tablet Take 1 tablet (500 mg total) by mouth 2 (two) times daily. (Patient not taking: Reported on 12/19/2015) 14 tablet 0  . norethindrone (AYGESTIN) 5 MG tablet Take 2 tablets (10 mg total) by mouth daily. 20 tablet 0  . Vitamin D, Ergocalciferol, (DRISDOL) 50000 UNITS CAPS Take 50,000 Units by mouth every 7 (seven) days. Sunday     No current facility-administered medications for this visit.   Chief complaint-noted Family history reviewed for today's visit. No changes. Social history- patient is a never smoker.  Objective: BP 144/90 mmHg  Pulse 90  Temp(Src) 97.9 F (36.6 C) (Oral)  Ht 5\' 7"  (1.702 m)  Wt 187 lb (84.823 kg)  BMI 29.28 kg/m2 Gen: NAD, alert, cooperative with exam HEENT: NCAT, EOMI, MMM Neck: FROM, supple CV: RRR, no murmur, discomfort to palpation of the chest wall directly inferior to the left breast Resp: CTABL, no wheezes, normal work of breathing GI: +BS, epigastric area with mild discomfort to palpation, Murphy's signs is negative, abdomen is soft, no rebound, no  guarding Msk: No edema, warm, normal tone, moves UE/LE spontaneously Neuro: Alert and oriented, no gross deficits Skin: No rashes, multiple nevi present throughout face, neck, and upper extremities. Psych: Appropriate behavior  Assessment/Plan: Left chest wall pain: Pt having pain underneath her left breast. Feels like a "catch". Used to occur occasionally, but now occurring daily. Not associated with exertion or eating. On exam, she has tenderness to palpation of that area. Likely  musculoskeletal. Low suspicion for ACS. - Will continue to monitor - Follow-up if not improved  Dizziness: Pt has been "feeling off balance" for 1 month. Started occurring after she got a new prescription for her glasses. Worse when looking at her computer or when she is moving around. Likely related to her vision. - Advised Pt to make a follow-up appointment with her eye doctor to rule out vision as a cause - Follow-up if not improved  Epigastric/RUQ Abdominal Pain: Pt having severe episodic epigastric/RUQ abdominal pain that is worse after eating. Also having some pain that radiates to her back. Differentials include cholelithiasis, gastritis, mild pancreatitis, GERD, indigestion. - Given that she is an obese female in her 54s, will order abdominal ultrasound to rule out gallstones - May need to consider starting her on a PPI - Will call her to discuss the results of the ultrasound  Prediabetes: Pt was told by her last PCP that she has diabetes. We have no A1c on file. Pt takes no medications and does not check her blood sugar at home. - HgbA1c performed in clinic today was 5.9%, consistent with prediabetes - Will encourage lifestyle modifications - Follow-up in 3 months  Hyperlipidemia: Pt was told by her former PCP that she had high cholesterol. Does not take any medications for it. - Will order lipid panel today - May start statin depending on results - Follow-up in 3 months  Health care maintenance: Last mammogram in 2016, which was normal. Has been many years since her last pap. Has never had a colonoscopy - Advised Pt to return for pap - Referral placed to GI for colonoscopy   Hyman Bible, MD PGY-1

## 2015-12-19 NOTE — Assessment & Plan Note (Signed)
Pt was told by her former PCP that she had high cholesterol. Does not take any medications for it. - Will order lipid panel today - May start statin depending on results - Follow-up in 3 months

## 2015-12-20 LAB — LIPID PANEL
CHOL/HDL RATIO: 4.8 ratio (ref <=5.0)
Cholesterol: 228 mg/dL — ABNORMAL HIGH (ref 125–200)
HDL: 48 mg/dL (ref >=46)
LDL CALC: 165 mg/dL — AB (ref <130)
Triglycerides: 76 mg/dL (ref <150)
VLDL: 15 mg/dL (ref <30)

## 2015-12-22 ENCOUNTER — Encounter: Payer: Self-pay | Admitting: Internal Medicine

## 2015-12-25 ENCOUNTER — Ambulatory Visit (HOSPITAL_COMMUNITY)
Admission: RE | Admit: 2015-12-25 | Discharge: 2015-12-25 | Disposition: A | Discharge status: Home / Self Care | Payer: BC Managed Care – PPO | Source: Ambulatory Visit | Attending: Family Medicine | Admitting: Family Medicine

## 2015-12-25 ENCOUNTER — Encounter: Payer: Self-pay | Admitting: Internal Medicine

## 2015-12-25 DIAGNOSIS — R1011 Right upper quadrant pain: Secondary | ICD-10-CM | POA: Diagnosis not present

## 2015-12-25 DIAGNOSIS — R1013 Epigastric pain: Secondary | ICD-10-CM | POA: Insufficient documentation

## 2015-12-29 ENCOUNTER — Telehealth: Payer: Self-pay | Admitting: Internal Medicine

## 2015-12-29 NOTE — Telephone Encounter (Signed)
I have sent Cassandra Osborne the results of her ultrasound in the mail. Please give her a call and let her know that the results were normal. We did not see any gallstones or anything else concerning. All of her organs looked fine. Thank you!

## 2015-12-29 NOTE — Telephone Encounter (Signed)
Patient request result for abdomen results. Please call patient.

## 2015-12-29 NOTE — Telephone Encounter (Signed)
Will forward to Dr. Mayo. Brucha Ahlquist,CMA  

## 2015-12-30 NOTE — Telephone Encounter (Signed)
If Cassandra Osborne is still having abdominal pain, please have her schedule an appointment to discuss this further. Thanks!

## 2015-12-30 NOTE — Telephone Encounter (Signed)
Tried calling patient to discuss results with her but there was no answer.  Will check with pcp to see what next steps are for this pain if it is still present when I speak to patient. Refujio Haymer,CMA

## 2015-12-30 NOTE — Assessment & Plan Note (Signed)
Pt was told by her last PCP that she has diabetes. We have no A1c on file. Pt takes no medications and does not check her blood sugar at home. - HgbA1c performed in clinic today was 5.9%, consistent with prediabetes - Will encourage lifestyle modifications - Follow-up in 3 months

## 2016-01-02 NOTE — Telephone Encounter (Signed)
lmovm for call back.  Please schedule appt Cassandra Osborne, Cassandra Osborne d

## 2016-03-02 ENCOUNTER — Ambulatory Visit (AMBULATORY_SURGERY_CENTER): Payer: Self-pay

## 2016-03-02 VITALS — Ht 66.0 in | Wt 197.0 lb

## 2016-03-02 DIAGNOSIS — Z1211 Encounter for screening for malignant neoplasm of colon: Secondary | ICD-10-CM

## 2016-03-02 MED ORDER — SUPREP BOWEL PREP KIT 17.5-3.13-1.6 GM/177ML PO SOLN: 1.0000 | Freq: Once | ORAL | Status: DC

## 2016-03-02 NOTE — Progress Notes (Signed)
No allergies to eggs or soy No past problems with anesthesia No home oxygen No diet meds  Has email and internet; registered for emmi 

## 2016-03-16 ENCOUNTER — Encounter: Payer: Self-pay | Admitting: Gastroenterology

## 2016-03-16 ENCOUNTER — Ambulatory Visit (AMBULATORY_SURGERY_CENTER): Payer: BC Managed Care – PPO | Admitting: Gastroenterology

## 2016-03-16 VITALS — BP 157/99 | HR 43 | Temp 97.3°F | Resp 10 | Ht 66.0 in | Wt 197.0 lb

## 2016-03-16 DIAGNOSIS — D123 Benign neoplasm of transverse colon: Secondary | ICD-10-CM | POA: Diagnosis not present

## 2016-03-16 DIAGNOSIS — D128 Benign neoplasm of rectum: Secondary | ICD-10-CM

## 2016-03-16 DIAGNOSIS — Z1211 Encounter for screening for malignant neoplasm of colon: Secondary | ICD-10-CM

## 2016-03-16 DIAGNOSIS — K621 Rectal polyp: Secondary | ICD-10-CM

## 2016-03-16 MED ORDER — SODIUM CHLORIDE 0.9 % IV SOLN: 500.0000 mL | INTRAVENOUS | Status: DC

## 2016-03-16 NOTE — Progress Notes (Signed)
No problems noted in the recovery room. maw 

## 2016-03-16 NOTE — Patient Instructions (Signed)
YOU HAD AN ENDOSCOPIC PROCEDURE TODAY AT [facility]:   Refer to the procedure report that was given to you for any specific questions about what was found during the examination.  If the procedure report does not answer your questions, please call your gastroenterologist to clarify.  If you requested that your care partner not be given the details of your procedure findings, then the procedure report has been included in a sealed envelope for you to review at your convenience later.  YOU SHOULD EXPECT: Some feelings of bloating in the abdomen. Passage of more gas than usual.  Walking can help get rid of the air that was put into your GI tract during the procedure and reduce the bloating. If you had a lower endoscopy (such as a colonoscopy or flexible sigmoidoscopy) you may notice spotting of blood in your stool or on the toilet paper. If you underwent a bowel prep for your procedure, you may not have a normal bowel movement for a few days.  Please Note:  You might notice some irritation and congestion in your nose or some drainage.  This is from the oxygen used during your procedure.  There is no need for concern and it should clear up in a day or so.  SYMPTOMS TO REPORT IMMEDIATELY:   Following lower endoscopy (colonoscopy or flexible sigmoidoscopy):  Excessive amounts of blood in the stool  Significant tenderness or worsening of abdominal pains  Swelling of the abdomen that is new, acute  Fever of 100F or higher  For urgent or emergent issues, a gastroenterologist can be reached at any hour by calling [phone].   DIET: Your first meal following the procedure should be a small meal and then it is ok to progress to your normal diet. Heavy or fried foods are harder to digest and may make you feel nauseous or bloated.  Likewise, meals heavy in dairy and vegetables can increase bloating.  Drink plenty of fluids but you should avoid alcoholic beverages for 24  hours.  ACTIVITY:  You should plan to take it easy for the rest of today and you should NOT DRIVE or use heavy machinery until tomorrow (because of the sedation medicines used during the test).    FOLLOW UP: Our staff will call the number listed on your records the next business day following your procedure to check on you and address any questions or concerns that you may have regarding the information given to you following your procedure. If we do not reach you, we will leave a message.  However, if you are feeling well and you are not experiencing any problems, there is no need to return our call.  We will assume that you have returned to your regular daily activities without incident.  If any biopsies were taken you will be contacted by phone or by letter within the next 1-3 weeks.  Please call us at [phone] if you have not heard about the biopsies in 3 weeks.    SIGNATURES/CONFIDENTIALITY: You and/or your care partner have signed paperwork which will be entered into your electronic medical record.  These signatures attest to the fact that that the information above on your After Visit Summary has been reviewed and is understood.  Full responsibility of the confidentiality of this discharge information lies with you and/or your care-partner.   Handout was given to your care partner on polyps. No aspirin, aspirin products,  ibuprofen, naproxen, advil, motrin, aleve, or other non-steroidal anti-inflammatory drugs  for 14 days after polyp removal. You may resume your other current medications today. Await biopsy results. Please call if any questions or concerns.

## 2016-03-16 NOTE — Progress Notes (Signed)
Called to room to assist during endoscopic procedure.  Patient ID and intended procedure confirmed with present staff. Received instructions for my participation in the procedure from the performing physician.  

## 2016-03-16 NOTE — Op Note (Addendum)
[place] Patient Name: Cassandra Osborne Procedure Date: 03/16/2016 10:36 AM MRN: [id number] Endoscopist: Remo Lipps P. Havery Moros , MD Age: 50 Referring MD:  Date of Birth: 02/28/1966 Gender: Female Account #: [id number] Procedure:                Colonoscopy Indications:              Screening for malignant neoplasm in the colon, This                            is the patient's first colonoscopy Medicines:                Monitored Anesthesia Care Procedure:                Pre-Anesthesia Assessment:                           - Prior to the procedure, a History and Physical                            was performed, and patient medications and                            allergies were reviewed. The patient's tolerance of                            previous anesthesia was also reviewed. The risks                            and benefits of the procedure and the sedation                            options and risks were discussed with the patient.                            All questions were answered, and informed consent                            was obtained. Prior Anticoagulants: The patient has                            taken no previous anticoagulant or antiplatelet                            agents. ASA Grade Assessment: II - A patient with                            mild systemic disease. After reviewing the risks                            and benefits, the patient was deemed in                            satisfactory condition to undergo the procedure.  After obtaining informed consent, the colonoscope                            was passed under direct vision. Throughout the                            procedure, the patient's blood pressure, pulse, and                            oxygen saturations were monitored continuously. The                            Model PCF-H190L [id number]) scope was introduced                            through the  anus and advanced to the the terminal                            ileum, with identification of the appendiceal                            orifice and IC valve. The colonoscopy was performed                            without difficulty. The patient tolerated the                            procedure well. The quality of the bowel                            preparation was good. The terminal ileum, ileocecal                            valve, appendiceal orifice, and rectum were                            photographed. Scope In: 10:44:18 AM Scope Out: 11:00:57 AM Scope Withdrawal Time: 0 hours 14 minutes 40 seconds  Total Procedure Duration: 0 hours 16 minutes 39 seconds  Findings:                 The perianal and digital rectal examinations were                            normal.                           A 4 mm polyp was found in the transverse colon. The                            polyp was sessile. The polyp was removed with a                            cold snare. Resection and retrieval were complete.  A diminutive polyp was found in the rectum. The                            polyp was sessile. The polyp was removed with a                            cold biopsy forceps. Resection and retrieval were                            complete.                           The terminal ileum appeared normal.                           The exam was otherwise without abnormality. Complications:            No immediate complications. Estimated blood loss:                            Minimal. Estimated Blood Loss:     Estimated blood loss was minimal. Impression:               - One 4 mm polyp in the transverse colon, removed                            with a cold snare. Resected and retrieved.                           - One diminutive polyp in the rectum, removed with                            a cold biopsy forceps. Resected and retrieved.                           - The  examined portion of the ileum was normal.                           - The examination was otherwise normal. Recommendation:           - Patient has a contact number available for                            emergencies. The signs and symptoms of potential                            delayed complications were discussed with the                            patient. Return to normal activities tomorrow.                            Written discharge instructions were provided to the  patient.                           - Resume previous diet.                           - Continue present medications.                           - No aspirin, ibuprofen, naproxen, or other                            non-steroidal anti-inflammatory drugs for 2 weeks                            after polyp removal.                           - Await pathology results.                           - Repeat colonoscopy is recommended for                            surveillance. The colonoscopy date will be                            determined after pathology results from today's                            exam become available for review. Remo Lipps P. Teneil Shiller, MD 03/16/2016 11:04:34 AM This report has been signed electronically.

## 2016-03-16 NOTE — Progress Notes (Signed)
Patient awakening,vss,report to rn 

## 2016-03-17 ENCOUNTER — Telehealth: Payer: Self-pay

## 2016-03-17 NOTE — Telephone Encounter (Signed)
  Follow up Call-  Call back number 03/16/2016  Post procedure Call Back phone  # [phone]  Permission to leave phone message Yes     Patient questions:  Do you have a fever, pain , or abdominal swelling? No. Pain Score  0 *  Have you tolerated food without any problems? Yes.    Have you been able to return to your normal activities? Yes.    Do you have any questions about your discharge instructions: Diet   No. Medications  No. Follow up visit  No.  Do you have questions or concerns about your Care? No.  Actions: * If pain score is 4 or above: No action needed, pain <4.

## 2016-03-18 ENCOUNTER — Encounter: Payer: Self-pay | Admitting: Gastroenterology

## 2016-09-28 ENCOUNTER — Other Ambulatory Visit: Payer: Self-pay | Admitting: Family Medicine

## 2016-09-28 DIAGNOSIS — Z1231 Encounter for screening mammogram for malignant neoplasm of breast: Secondary | ICD-10-CM

## 2016-10-05 ENCOUNTER — Ambulatory Visit
Admission: RE | Admit: 2016-10-05 | Discharge: 2016-10-05 | Disposition: A | Discharge status: Home / Self Care | Payer: 59 | Source: Ambulatory Visit | Attending: Family Medicine | Admitting: Family Medicine

## 2016-10-05 DIAGNOSIS — Z1231 Encounter for screening mammogram for malignant neoplasm of breast: Secondary | ICD-10-CM

## 2016-10-24 ENCOUNTER — Emergency Department (HOSPITAL_COMMUNITY): Payer: 59

## 2016-10-24 ENCOUNTER — Other Ambulatory Visit: Payer: Self-pay

## 2016-10-24 ENCOUNTER — Emergency Department (HOSPITAL_COMMUNITY)
Admission: EM | Admit: 2016-10-24 | Discharge: 2016-10-24 | Disposition: A | Discharge status: Home / Self Care | Payer: 59 | Attending: Emergency Medicine | Admitting: Emergency Medicine

## 2016-10-24 ENCOUNTER — Encounter (HOSPITAL_COMMUNITY): Payer: Self-pay | Admitting: *Deleted

## 2016-10-24 ENCOUNTER — Emergency Department (HOSPITAL_BASED_OUTPATIENT_CLINIC_OR_DEPARTMENT_OTHER)
Admit: 2016-10-24 | Discharge: 2016-10-24 | Disposition: A | Discharge status: Home / Self Care | Payer: 59 | Attending: Emergency Medicine | Admitting: Emergency Medicine

## 2016-10-24 DIAGNOSIS — M79604 Pain in right leg: Secondary | ICD-10-CM

## 2016-10-24 DIAGNOSIS — R200 Anesthesia of skin: Secondary | ICD-10-CM

## 2016-10-24 DIAGNOSIS — M79661 Pain in right lower leg: Secondary | ICD-10-CM | POA: Diagnosis not present

## 2016-10-24 DIAGNOSIS — R0602 Shortness of breath: Secondary | ICD-10-CM | POA: Diagnosis not present

## 2016-10-24 DIAGNOSIS — F410 Panic disorder [episodic paroxysmal anxiety] without agoraphobia: Secondary | ICD-10-CM | POA: Diagnosis not present

## 2016-10-24 DIAGNOSIS — R202 Paresthesia of skin: Secondary | ICD-10-CM

## 2016-10-24 DIAGNOSIS — F419 Anxiety disorder, unspecified: Secondary | ICD-10-CM | POA: Insufficient documentation

## 2016-10-24 DIAGNOSIS — Z87891 Personal history of nicotine dependence: Secondary | ICD-10-CM | POA: Diagnosis not present

## 2016-10-24 DIAGNOSIS — R0789 Other chest pain: Secondary | ICD-10-CM | POA: Diagnosis present

## 2016-10-24 DIAGNOSIS — M79609 Pain in unspecified limb: Secondary | ICD-10-CM | POA: Diagnosis not present

## 2016-10-24 DIAGNOSIS — R079 Chest pain, unspecified: Secondary | ICD-10-CM

## 2016-10-24 LAB — CBC
HEMATOCRIT: 40.5 % (ref 36.0–46.0)
Hemoglobin: 13 g/dL (ref 12.0–15.0)
MCH: 25 pg — AB (ref 26.0–34.0)
MCHC: 32.1 g/dL (ref 30.0–36.0)
MCV: 77.9 fL — AB (ref 78.0–100.0)
PLATELETS: 218 10*3/uL (ref 150–400)
RBC: 5.2 MIL/uL — ABNORMAL HIGH (ref 3.87–5.11)
RDW: 13.9 % (ref 11.5–15.5)
WBC: 4.3 10*3/uL (ref 4.0–10.5)

## 2016-10-24 LAB — BASIC METABOLIC PANEL
Anion gap: 8 (ref 5–15)
BUN: 9 mg/dL (ref 6–20)
CHLORIDE: 107 mmol/L (ref 101–111)
CO2: 26 mmol/L (ref 22–32)
CREATININE: 0.71 mg/dL (ref 0.44–1.00)
Calcium: 9.9 mg/dL (ref 8.9–10.3)
GFR calc Af Amer: >60 mL/min (ref >60)
GFR calc non Af Amer: >60 mL/min (ref >60)
GLUCOSE: 97 mg/dL (ref 65–99)
POTASSIUM: 3.9 mmol/L (ref 3.5–5.1)
Sodium: 141 mmol/L (ref 135–145)

## 2016-10-24 LAB — LIPASE, BLOOD: Lipase: 17 U/L (ref 11–51)

## 2016-10-24 LAB — BRAIN NATRIURETIC PEPTIDE: B Natriuretic Peptide: 14.2 pg/mL (ref 0.0–100.0)

## 2016-10-24 LAB — I-STAT TROPONIN, ED
Troponin i, poc: 0 ng/mL (ref 0.00–0.08)
Troponin i, poc: 0 ng/mL (ref 0.00–0.08)

## 2016-10-24 MED ORDER — ASPIRIN 81 MG PO CHEW
324.0000 mg | CHEWABLE_TABLET | Freq: Once | ORAL | Status: AC
  Administered 2016-10-24: 324 mg via ORAL
  Filled 2016-10-24: qty 4

## 2016-10-24 MED ORDER — LORAZEPAM 1 MG PO TABS
1.0000 mg | ORAL_TABLET | Freq: Once | ORAL | Status: AC
  Administered 2016-10-24: 1 mg via ORAL
  Filled 2016-10-24: qty 1

## 2016-10-24 MED ORDER — GI COCKTAIL ~~LOC~~
30.0000 mL | Freq: Once | ORAL | Status: AC
  Administered 2016-10-24: 30 mL via ORAL
  Filled 2016-10-24: qty 30

## 2016-10-24 MED ORDER — LORAZEPAM 1 MG PO TABS: 1.0000 mg | ORAL_TABLET | Freq: Three times a day (TID) | ORAL | 0 refills | Status: DC | PRN

## 2016-10-24 NOTE — ED Triage Notes (Signed)
Pt reports pain to right arm, shoulder, mid chest pain and right arm pain. Has been intermittent but more severe today and sob with mild exertion.

## 2016-10-24 NOTE — ED Notes (Signed)
Patient transported to CT 

## 2016-10-24 NOTE — Discharge Instructions (Signed)
Please take the anxiety medicine as needed to help with your stress and anxiety episodes. Please call tomorrow to schedule an appointment with your primary doctor in the next few days to discuss ongoing management of your symptoms. Today, we did not find evidence of infection, lung problems, or heart problems however, she began having any new or worsened symptoms, please return to the nearest emergency department.

## 2016-10-24 NOTE — ED Provider Notes (Signed)
[facility] DEPT Provider Note   CSN: [id number] Arrival date & time: 10/24/16  1227     History   Chief Complaint Chief Complaint  Patient presents with  . Chest Pain  . Back Pain    HPI Cassandra Osborne is a 51 y.o. female with a past medical history significant for hypercholesterolemia who presents with several complaints including chest pain, right arm pain, and right leg pain. Patient describes that for the last year, she has been having intermittent left-sided chest pain which she reports is under her left breast. She says it is a sharp, cramping, and catching-like pain. She says that it has began to radiate towards her central chest which is newer. She says that she has also been having a 2 week history of right-sided leg pain. She says the pain is in her right shin that is aching. She thinks it is also swollen compared to left. She says that pain is moderate in severity. She denies any prior injuries or problems with the leg in the past. She denies history of DVT or PE. She reports that she is also having some pain in her right arm that radiates towards her upper shoulder and scapular area. Next  Julaine Hua report some associated nausea but no vomiting. She denies any fevers, chills, cough, congestion. She denies any constipation but does report some diarrhea today with her nausea. She denies any abdominal pain but says that she has a heartburn type sensation which he thinks is related to food. She said the food made it worse.   She denies any current smoking and denies any family history of cardiac disease.   HPI  Past Medical History:  Diagnosis Date  . High cholesterol   . No pertinent past medical history     Patient Active Problem List   Diagnosis Date Noted  . Hyperlipidemia 12/19/2015  . Chest wall pain 12/19/2015  . Prediabetes 12/19/2015  . Dizziness 12/19/2015  . Abdominal pain, epigastric 12/19/2015  . Health care maintenance 12/19/2015  . Perimenopausal  07/01/2014    Past Surgical History:  Procedure Laterality Date  . LUMBAR LAMINECTOMY/DECOMPRESSION MICRODISCECTOMY  09/18/2012   Procedure: LUMBAR LAMINECTOMY/DECOMPRESSION MICRODISCECTOMY 1 LEVEL;  Surgeon: Winfield Cunas, MD;  Location: [facility];  Service: Neurosurgery;  Laterality: Left;  Left Lumbar Four-Five Diskectomy    OB History    Gravida Para Term Preterm AB Living   2 2 2     2    SAB TAB Ectopic Multiple Live Births           2       Home Medications    Prior to Admission medications   Medication Sig Start Date End Date Taking? Authorizing Provider  ibuprofen (ADVIL,MOTRIN) 800 MG tablet Take 800 mg by mouth every 8 (eight) hours as needed. Reported on 12/19/2015    Historical Provider, MD  Vitamin D, Ergocalciferol, (DRISDOL) 50000 UNITS CAPS Take 50,000 Units by mouth every 7 (seven) days. Sunday    Historical Provider, MD    Family History Family History  Problem Relation Age of Onset  . Stroke Father   . Diabetes Father   . Hyperlipidemia Father   . Hypertension Mother   . Cancer    . Breast cancer Maternal Aunt   . Colon cancer Neg Hx   . Esophageal cancer Neg Hx   . Stomach cancer Neg Hx   . Rectal cancer Neg Hx     Social History Social History  Substance Use  Topics  . Smoking status: Former Smoker    Types: Cigarettes  . Smokeless tobacco: Never Used     Comment:    . Alcohol use No     Allergies   Septra [sulfamethoxazole-trimethoprim]   Review of Systems Review of Systems  Constitutional: Negative for activity change, chills, diaphoresis, fatigue and fever.  HENT: Negative for congestion and rhinorrhea.   Eyes: Negative for visual disturbance.  Respiratory: Positive for chest tightness. Negative for cough, shortness of breath and stridor.   Cardiovascular: Positive for chest pain. Negative for palpitations and leg swelling.  Gastrointestinal: Negative for abdominal distention, abdominal pain, constipation, diarrhea, nausea and  vomiting.  Genitourinary: Negative for difficulty urinating, dysuria, flank pain, frequency, hematuria, menstrual problem, pelvic pain, vaginal bleeding and vaginal discharge.  Musculoskeletal: Negative for back pain and neck pain.  Skin: Negative for rash and wound.  Neurological: Positive for numbness. Negative for dizziness, weakness, light-headedness and headaches.  Psychiatric/Behavioral: Positive for agitation. Negative for confusion.  All other systems reviewed and are negative.    Physical Exam Updated Vital Signs BP 161/82   Pulse (!) 51   Temp 98.2 F (36.8 C) (Oral)   Resp 14   LMP 09/27/2014 (Approximate)   SpO2 100%   Physical Exam  Constitutional: She is oriented to person, place, and time. She appears well-developed and well-nourished. No distress.  HENT:  Head: Normocephalic and atraumatic.  Right Ear: External ear normal.  Left Ear: External ear normal.  Nose: Nose normal.  Mouth/Throat: Oropharynx is clear and moist. No oropharyngeal exudate.  Eyes: Conjunctivae and EOM are normal. Pupils are equal, round, and reactive to light.  Neck: Normal range of motion. Neck supple.  Cardiovascular: Regular rhythm, normal heart sounds and intact distal pulses.  Tachycardia present.   No murmur heard. Patient became tachycar during what appeared to be panic attack. This result and heart rate was normal.  Pulmonary/Chest: Effort normal and breath sounds normal. No stridor. No respiratory distress. She has no wheezes. She has no rales. She exhibits no tenderness.  Abdominal: She exhibits no distension. There is no tenderness. There is no rebound.  Musculoskeletal: She exhibits no edema or tenderness.       Right lower leg: She exhibits no edema.  No significant right lower extremity Edema was appreciated.  Completely unremarkable right upper extremity exam. Normal sensation, strength, pulses, reflexes, coordination.  Neurological: She is alert and oriented to person,  place, and time. She has normal reflexes. No cranial nerve deficit or sensory deficit. Coordination normal.  Skin: Skin is warm. Capillary refill takes less than 2 seconds. No rash noted. She is not diaphoretic. No erythema.  Psychiatric: Her mood appears anxious. She is agitated.  Nursing note and vitals reviewed.    ED Treatments / Results  Labs (all labs ordered are listed, but only abnormal results are displayed) Labs Reviewed  CBC - Abnormal; Notable for the following:       Result Value   RBC 5.20 (*)    MCV 77.9 (*)    MCH 25.0 (*)    All other components within normal limits  BASIC METABOLIC PANEL  LIPASE, BLOOD  BRAIN NATRIURETIC PEPTIDE  I-STAT TROPOININ, ED  I-STAT TROPOININ, ED    EKG  EKG Interpretation  Date/Time:  Sunday October 24 2016 12:53:04 EST Ventricular Rate:  50 PR Interval:  198 QRS Duration: 74 QT Interval:  458 QTC Calculation: 417 R Axis:   38 Text Interpretation:  Sinus bradycardia Cannot rule out  Anterior infarct , age undetermined Abnormal ECG No prior ECG for comparison No STEMI Confirmed by [facility] MD, Emerald Gehres [phone]) on 10/24/2016 3:09:18 PM       Radiology Dg Chest 2 View  Result Date: 10/24/2016 CLINICAL DATA:  Chest pain EXAM: CHEST  2 VIEW COMPARISON:  None. FINDINGS: The heart size and mediastinal contours are within normal limits. Both lungs are clear. The visualized skeletal structures are unremarkable. IMPRESSION: No active cardiopulmonary disease. Electronically Signed   By: Franchot Gallo M.D.   On: 10/24/2016 13:21   Ct Head Wo Contrast  Result Date: 10/24/2016 CLINICAL DATA:  Patient with intermittent right arm pain and numbness. EXAM: CT HEAD WITHOUT CONTRAST TECHNIQUE: Contiguous axial images were obtained from the base of the skull through the vertex without intravenous contrast. COMPARISON:  None. FINDINGS: Brain: Ventricles and sulci are appropriate for patient's age. No evidence for acute cortically based infarct,  intracranial hemorrhage, mass lesion or mass-effect. Vascular: Unremarkable Skull: Intact. Sinuses/Orbits: Mucosal thickening involving the maxillary sinuses and ethmoid air cells. Mastoid air cells unremarkable. Orbits are unremarkable. Other: None. IMPRESSION: No acute intracranial process. Paranasal sinus mucosal thickening. Electronically Signed   By: Lovey Newcomer M.D.   On: 10/24/2016 17:35    Procedures Procedures (including critical care time)  Medications Ordered in ED Medications - No data to display   Initial Impression / Assessment and Plan / ED Course  I have reviewed the triage vital signs and the nursing notes.  Pertinent labs & imaging results that were available during my care of the patient were reviewed by me and considered in my medical decision making (see chart for details).  Clinical Course as of Oct 24 1537  Sun Oct 24, 2016  1525 MCHC: 32.1 [CT]    Clinical Course User Index [CT] Gwenyth Allegra Jaselynn Tamas, MD    Cassandra Osborne is a 52 y.o. female with a past medical history significant for hypercholesterolemia who presents with several complaints including chest pain, right arm pain, and right leg pain.  History and exam are seen above.  Patient has completely unremarkable Physical exam aside from evidence of anxiety. Patient appears more anxious when more family is present.  Patient will have extensive workup to look for cause of chronic chest pain, tingling sensation in arm, and reported swelling and pain of right leg.  Diagnostic workup results are seen above. EKG revealed no STEMI. BNP nonelevated, doubt CHF causing reported edema. DVT ultrasound ordered and was negative. Physical exam continued to not show lower extremity edema. Troponin negative, Other laboratory testin unremarkable.  Chest x-ray shows no pneumonia.    5:01 PM Nursing called report patient was having episode of tachycardia, tachypnea, and anxiety. Patient appear to be having an  anxiety attack. Patient was redirected, did slow breathing techniques, and her heart rate improved from the 150s down to the 70s. The only complaint the patient said was different was right arm tingling that worsened.  Given the right arm symptoms, patient will have a CT head to look for acute abnormality.  CT head revealed no acute abnormalities.  Based on reassuring Diagnostic workup and observed episode of panic attack, suspect anxiety and panic is the cause of her symptoms. Patient given one dose of Ativan with improvement in anxiety and symptoms. Patient given short course prescription for Ativan.  Patient given follow-up instructions with PCP and strict return precautions for any worsening symptoms. Patient and family agreed with plan of care. Patient discharged in good condition.  Final Clinical Impressions(s) / ED Diagnoses   Final diagnoses:  Chest pain, unspecified type  Numbness and tingling of right arm  Pain of right lower extremity  Anxiety  Panic attack    New Prescriptions Discharge Medication List as of 10/24/2016  7:48 PM    START taking these medications   Details  LORazepam (ATIVAN) 1 MG tablet Take 1 tablet (1 mg total) by mouth 3 (three) times daily as needed for anxiety., Starting Sun 10/24/2016, Print        Clinical Impression: 1. Chest pain, unspecified type   2. Numbness and tingling of right arm   3. Pain of right lower extremity   4. Anxiety   5. Panic attack     Disposition: Discharge  Condition: Good  I have discussed the results, Dx and Tx plan with the pt(& family if present). He/she/they expressed understanding and agree(s) with the plan. Discharge instructions discussed at great length. Strict return precautions discussed and pt &/or family have verbalized understanding of the instructions. No further questions at time of discharge.    Discharge Medication List as of 10/24/2016  7:48 PM    START taking these medications   Details    LORazepam (ATIVAN) 1 MG tablet Take 1 tablet (1 mg total) by mouth 3 (three) times daily as needed for anxiety., Starting Sun 10/24/2016, Print        Follow Up: [place] [id number] [place] [phone] [place], MD 10/25/16 [id number]

## 2016-10-24 NOTE — Progress Notes (Addendum)
VASCULAR LAB PRELIMINARY  PRELIMINARY  PRELIMINARY  PRELIMINARY  Right lower extremity venous duplex completed.    Preliminary report:  There is no DVT or SVT noted in the right lower extremity.   Called report to Dr. Emelda Fear, [facility], RVT 10/24/2016, 4:10 PM

## 2016-10-24 NOTE — ED Notes (Signed)
Brought patient back to room; patient already in gown; patient placed on monitor, continuous pulse oximetry and blood pressure cuff

## 2016-10-24 NOTE — ED Notes (Signed)
Patient transported to vascular. 

## 2016-10-29 ENCOUNTER — Encounter: Payer: Self-pay | Admitting: Internal Medicine

## 2016-10-29 ENCOUNTER — Other Ambulatory Visit (HOSPITAL_COMMUNITY)
Admission: RE | Admit: 2016-10-29 | Discharge: 2016-10-29 | Disposition: A | Discharge status: Home / Self Care | Payer: 59 | Source: Ambulatory Visit | Attending: Family Medicine | Admitting: Family Medicine

## 2016-10-29 ENCOUNTER — Ambulatory Visit (INDEPENDENT_AMBULATORY_CARE_PROVIDER_SITE_OTHER): Payer: 59 | Admitting: Internal Medicine

## 2016-10-29 VITALS — BP 132/90 | HR 58 | Temp 97.8°F | Ht 66.0 in | Wt 203.4 lb

## 2016-10-29 DIAGNOSIS — Z124 Encounter for screening for malignant neoplasm of cervix: Secondary | ICD-10-CM

## 2016-10-29 DIAGNOSIS — R0789 Other chest pain: Secondary | ICD-10-CM | POA: Diagnosis not present

## 2016-10-29 DIAGNOSIS — Z01419 Encounter for gynecological examination (general) (routine) without abnormal findings: Secondary | ICD-10-CM | POA: Diagnosis present

## 2016-10-29 DIAGNOSIS — Z1151 Encounter for screening for human papillomavirus (HPV): Secondary | ICD-10-CM | POA: Diagnosis present

## 2016-10-29 DIAGNOSIS — Z794 Long term (current) use of insulin: Secondary | ICD-10-CM

## 2016-10-29 DIAGNOSIS — R1013 Epigastric pain: Secondary | ICD-10-CM

## 2016-10-29 DIAGNOSIS — E1121 Type 2 diabetes mellitus with diabetic nephropathy: Secondary | ICD-10-CM

## 2016-10-29 LAB — POCT GLYCOSYLATED HEMOGLOBIN (HGB A1C): HEMOGLOBIN A1C: 5.9

## 2016-10-29 MED ORDER — OMEPRAZOLE 20 MG PO CPDR: 20.0000 mg | DELAYED_RELEASE_CAPSULE | Freq: Every day | ORAL | 1 refills | Status: DC

## 2016-10-29 MED ORDER — DICLOFENAC SODIUM 1 % TD GEL: 4.0000 g | Freq: Four times a day (QID) | TRANSDERMAL | 0 refills | Status: DC

## 2016-10-29 NOTE — Patient Instructions (Signed)
It was so nice to see you!  I have prescribed some Voltaren gel for your chest wall pain. You can use this up to 4 times daily.   For your stomach pain, I have prescribed some Prilosec. Please use this daily for 4 weeks. Give me a call after 4 weeks if your symptoms aren't better and we can talk about sending you to the gastroenterologist for a possible scope.  I will call you with your pap results.  Please follow-up in the next month to discuss your prediabetes and high cholesterol.  -Dr. Brett Albino

## 2016-10-29 NOTE — Progress Notes (Signed)
   [facility] Phone: [phone]  Subjective:  Cassandra Osborne is a 51 year old female presenting to clinic with left sided chest pain, epigastric pain, and to have a pap smear performed.  Chest pain: Pt states she has pain underneath her left breast. This has been going on for the last year. The pain comes and goes. The pain does not radiate. The pain feels like a "catch". The pain is worse with moving certain ways. The pain is sharp. The pain does not radiate. The pain is not associated with nausea, shortness of breath, or diaphoresis. She was seen in the ED on 1/28 for the chest pain. Complete work-up was negative including EKG, BNP, troponin, basic labs, and CXR.   Epigastric Pain: Pt states she has been having a "gnawing" pain that "feels like acid" in her epigastric area over the last few months. It also "feels like a hunger pain". The pain comes and goes. She feels like it is worse with stress. She has the occasional sensation that food gets stuck. She has been using tums at home, which help. Not taking any NSAIDs.  Pap smear:  Pt overdue for pap. She has never had an abnormal pap smear before.  ROS: See HPI for pertinent positives and negatives  Past Medical History- prediabetes, HLD  Family history reviewed for today's visit. No changes.  Social history- patient is a former smoker  Objective: BP 132/90 (BP Location: Left Arm, Patient Position: Sitting, Cuff Size: Large)   Pulse (!) 58   Temp 97.8 F (36.6 C) (Oral)   Ht 5\' 6"  (1.676 m)   Wt 203 lb 6.4 oz (92.3 kg)   LMP 09/27/2014 (Approximate)   SpO2 99%   BMI 32.83 kg/m  Gen: NAD, alert, cooperative with exam HEENT: NCAT, EOMI, MMM Neck: FROM, supple CV: RRR, no murmur, tenderness to palpation of the chest wall on the left flank and under the left breast Resp: CTABL, no wheezes, normal work of breathing GI: +BS, non-tender, non-distended, soft, no rebound, no guarding. GU: External genitalia normal,  vaginal walls normal, cervix normal, small amount of white/clear vaginal discharge present. Msk: No edema, warm, normal tone, moves UE/LE spontaneously  Assessment/Plan: Chest Wall Pain: Chronic. Pain is worse with moving and twisting and pain is reproducible with palpation of the left chest wall. She was recently seen in the ED and had a negative cardiac work-up. Think cardiac etiology is unlikely, because she is not having pain with exertion and she does not have any associated SOB, diaphoresis, or nausea. - Pt given handout for exercises and stretches to improve costochondritis - Voltaren gel prescribed - Reassurance provided - Follow-up if not improving  Epigastric Pain: Pt endorses "gnawing" epigastric pain. Taking tums, which helps. Not taking any NSAIDs. Pt had abdominal US performed on 11/28/15, which showed mildly prominent common bile duct (81mm), but no gallstones. - Trial of Prilosec 20mg  daily x 4 weeks - If no improvement in symptoms after 4 weeks, will consider referral to GI. Can also consider LFTs, lipase.  Screening for cervical cancer: - Pap performed today   Hyman Bible, MD PGY-2

## 2016-10-30 DIAGNOSIS — Z124 Encounter for screening for malignant neoplasm of cervix: Secondary | ICD-10-CM | POA: Insufficient documentation

## 2016-10-30 NOTE — Assessment & Plan Note (Signed)
Pt endorses "gnawing" epigastric pain. Taking tums, which helps. Not taking any NSAIDs. Pt had abdominal US performed on 11/28/15, which showed mildly prominent common bile duct (66mm), but no gallstones. - Trial of Prilosec 20mg  daily x 4 weeks - If no improvement in symptoms after 4 weeks, will consider referral to GI. Can also consider LFTs, lipase.

## 2016-10-30 NOTE — Assessment & Plan Note (Signed)
-   Pap performed today

## 2016-10-30 NOTE — Assessment & Plan Note (Signed)
Chronic. Pain is worse with moving and twisting and pain is reproducible with palpation of the left chest wall. She was recently seen in the ED and had a negative cardiac work-up. Think cardiac etiology is unlikely, because she is not having pain with exertion and she does not have any associated SOB, diaphoresis, or nausea. - Pt given handout for exercises and stretches to improve costochondritis - Voltaren gel prescribed - Reassurance provided - Follow-up if not improving

## 2016-11-02 ENCOUNTER — Telehealth: Payer: Self-pay

## 2016-11-02 LAB — CYTOLOGY - PAP
Diagnosis: NEGATIVE
HPV (WINDOPATH): NOT DETECTED

## 2016-11-02 NOTE — Telephone Encounter (Signed)
Pt informed of normal pap smear result.

## 2016-11-02 NOTE — Telephone Encounter (Signed)
Pt called stating she was supposed to get a prescription for her triamcinolone cream 1% #1gram tube at her visit last week. Pt states pharmacy never received it. Please advise.

## 2016-11-10 ENCOUNTER — Other Ambulatory Visit: Payer: Self-pay | Admitting: Internal Medicine

## 2017-11-25 ENCOUNTER — Encounter: Payer: Self-pay | Admitting: Internal Medicine

## 2017-11-25 ENCOUNTER — Ambulatory Visit (INDEPENDENT_AMBULATORY_CARE_PROVIDER_SITE_OTHER): Payer: 59 | Admitting: Internal Medicine

## 2017-11-25 ENCOUNTER — Other Ambulatory Visit: Payer: Self-pay

## 2017-11-25 VITALS — BP 130/80 | HR 53 | Temp 98.0°F | Wt 203.0 lb

## 2017-11-25 DIAGNOSIS — Z1159 Encounter for screening for other viral diseases: Secondary | ICD-10-CM | POA: Diagnosis not present

## 2017-11-25 DIAGNOSIS — E785 Hyperlipidemia, unspecified: Secondary | ICD-10-CM

## 2017-11-25 DIAGNOSIS — Z114 Encounter for screening for human immunodeficiency virus [HIV]: Secondary | ICD-10-CM | POA: Diagnosis not present

## 2017-11-25 DIAGNOSIS — R7303 Prediabetes: Secondary | ICD-10-CM | POA: Diagnosis not present

## 2017-11-25 DIAGNOSIS — K219 Gastro-esophageal reflux disease without esophagitis: Secondary | ICD-10-CM

## 2017-11-25 DIAGNOSIS — Z0000 Encounter for general adult medical examination without abnormal findings: Secondary | ICD-10-CM

## 2017-11-25 DIAGNOSIS — R1013 Epigastric pain: Secondary | ICD-10-CM | POA: Insufficient documentation

## 2017-11-25 LAB — POCT GLYCOSYLATED HEMOGLOBIN (HGB A1C): Hemoglobin A1C: 6.2

## 2017-11-25 MED ORDER — OMEPRAZOLE 40 MG PO CPDR
40.0000 mg | DELAYED_RELEASE_CAPSULE | Freq: Every day | ORAL | 0 refills | Status: DC
Start: 2017-11-25 — End: 2020-01-30

## 2017-11-25 NOTE — Assessment & Plan Note (Signed)
Last lipid panel 11/2015 with chol 228, HDL 48, LDL 165, TG 76. Has never been on a statin. - Repeat lipid panel

## 2017-11-25 NOTE — Patient Instructions (Addendum)
It was so nice to see you!  For your heartburn, I have prescribed Prilosec. Please take this once a day for 3 months. If you are still having symptoms, then we can talk about sending you to the GI doctor for further evaluation.  I have ordered some labs today and I will call you with these results.  Please come back in 3 months for recheck of your blood pressure.  -Dr. Brett Albino

## 2017-11-25 NOTE — Assessment & Plan Note (Signed)
-   Check BMP, HIV, Hep C

## 2017-11-25 NOTE — Assessment & Plan Note (Signed)
Uncontrolled with TUMS. No epigastric pain. - Will try Prilosec 40mg  daily x 12 weeks - Decrease intake of chocolate - If no improvement, will consider urea breath test vs referral to GI

## 2017-11-25 NOTE — Assessment & Plan Note (Signed)
Controlled. A1c 6.2% today. - Discussed importance of regular exercise and diet. Discussed ways to increase exercise throughout the day (parking car far away from store in the parking lot, etc). - Foot exam, urine microalbumin, ophtho referral at next visit - Follow-up in 3 months

## 2017-11-25 NOTE — Progress Notes (Signed)
   [facility] Phone: [phone]  Subjective:  Cassandra Osborne is a 52 year old female presenting to clinic to discuss GERD, HLD, and prediabetes.  GERD: Has been going on for a year. Using a lot of tums, which helps ease off the heartburn. She states that food sometimes feels weird going down her throat, but denies any dysphagia. The heartburn is worse with eating chocolate and patient states she eats chocolate all the time. Has been on Prilosec in the past, but only took it a couple of times. States it helped when she took it. No recent weight loss. No vomiting. No epigastric pain.  HLD: Last lipid panel with LDL of 165. Has never taken a statin. States she does not exercise or eat healthy.  Prediabetes: Last A1c was 5.9. Does not take any medications. Does not check her blood pressure. Does not eat healthy or exercise. No polyuria, no polydipsia.  ROS: See HPI for pertinent positives and negatives  Past Medical History- HLD, prediabetes  Family history reviewed for today's visit. No changes.  Social history- patient is a former smoker  Objective: BP 130/80   Pulse (!) 53   Temp 98 F (36.7 C) (Oral)   Wt 203 lb (92.1 kg)   LMP 09/27/2014 (Approximate)   SpO2 98%   BMI 32.77 kg/m  Gen: NAD, alert, cooperative with exam HEENT: NCAT, EOMI, MMM Neck: FROM, supple CV: RRR, no murmur Resp: CTABL, no wheezes, normal work of breathing GI: No epigastric pain  Assessment/Plan: GERD: Uncontrolled with TUMS. No epigastric pain. - Will try Prilosec 40mg  daily x 12 weeks - Decrease intake of chocolate - If no improvement, will consider urea breath test vs referral to GI  HLD: Last lipid panel 11/2015 with chol 228, HDL 48, LDL 165, TG 76. Has never been on a statin. - Repeat lipid panel  Prediabetes: Controlled. A1c 6.2% today. - Discussed importance of regular exercise and diet. Discussed ways to increase exercise throughout the day (parking car far away  from store in the parking lot, etc). - Foot exam, urine microalbumin, ophtho referral at next visit - Follow-up in 3 months  Health Care Maintenance: - Check BMP, HIV, Hep C   Hyman Bible, MD PGY-3

## 2017-11-26 LAB — BASIC METABOLIC PANEL
BUN / CREAT RATIO: 18 (ref 9–23)
BUN: 12 mg/dL (ref 6–24)
CHLORIDE: 107 mmol/L — AB (ref 96–106)
CO2: 23 mmol/L (ref 20–29)
Calcium: 9.5 mg/dL (ref 8.7–10.2)
Creatinine, Ser: 0.68 mg/dL (ref 0.57–1.00)
GFR calc non Af Amer: 99 mL/min/{1.73_m2} (ref >59)
GFR, EST AFRICAN AMERICAN: 115 mL/min/{1.73_m2} (ref >59)
GLUCOSE: 105 mg/dL — AB (ref 65–99)
Potassium: 4.1 mmol/L (ref 3.5–5.2)
SODIUM: 143 mmol/L (ref 134–144)

## 2017-11-26 LAB — LIPID PANEL
CHOL/HDL RATIO: 5 ratio — AB (ref 0.0–4.4)
Cholesterol, Total: 226 mg/dL — ABNORMAL HIGH (ref 100–199)
HDL: 45 mg/dL (ref >39)
LDL CALC: 161 mg/dL — AB (ref 0–99)
Triglycerides: 101 mg/dL (ref 0–149)
VLDL CHOLESTEROL CAL: 20 mg/dL (ref 5–40)

## 2017-11-26 LAB — HEPATITIS C ANTIBODY

## 2017-11-26 LAB — HIV ANTIBODY (ROUTINE TESTING W REFLEX): HIV SCREEN 4TH GENERATION: NONREACTIVE

## 2017-11-30 ENCOUNTER — Telehealth: Payer: Self-pay | Admitting: Internal Medicine

## 2017-11-30 NOTE — Telephone Encounter (Signed)
Called patient to discuss her lab results. Left a voicemail asking her to call us back. Her bad cholesterol was a little elevated, but not to point that we need to start any medications. Her kidney function was normal. Her Hepatitis C and HIV tests were negative.  Would be happy to discuss with her further if she has any additional questions.  Hyman Bible, MD PGY-3

## 2017-11-30 NOTE — Telephone Encounter (Signed)
Will forward to Dr. Brett Albino. Deriana Vanderhoef,CMA

## 2017-11-30 NOTE — Telephone Encounter (Signed)
Pt would like to know her lab results from her appointment last Friday. Please call her to discuss those.

## 2017-12-01 NOTE — Telephone Encounter (Signed)
Patient informed of her results and voiced understanding.  She would like to know if she can get a refill of her vitamin D and have this level rechecked next time she is in the office.  Will forward to MD.  Johnney Ou

## 2017-12-01 NOTE — Telephone Encounter (Signed)
Pt returning [facility] phone call. Pt goes to work at 4 and would like a call before then, if not she said it's fine to leave a message on [phone] with all the information/results.

## 2017-12-02 ENCOUNTER — Other Ambulatory Visit: Payer: Self-pay | Admitting: Internal Medicine

## 2017-12-02 MED ORDER — VITAMIN D (ERGOCALCIFEROL) 1.25 MG (50000 UNIT) PO CAPS: 50000.0000 [IU] | ORAL_CAPSULE | ORAL | 0 refills | Status: DC

## 2017-12-02 NOTE — Telephone Encounter (Signed)
Vitamin D refilled. Will plan to check vitamin D level at next visit.

## 2018-03-20 ENCOUNTER — Ambulatory Visit
Admission: RE | Admit: 2018-03-20 | Discharge: 2018-03-20 | Disposition: A | Discharge status: Home / Self Care | Payer: 59 | Source: Ambulatory Visit | Attending: Family Medicine | Admitting: Family Medicine

## 2018-03-20 ENCOUNTER — Other Ambulatory Visit: Payer: Self-pay

## 2018-03-20 ENCOUNTER — Ambulatory Visit (INDEPENDENT_AMBULATORY_CARE_PROVIDER_SITE_OTHER): Payer: 59 | Admitting: Internal Medicine

## 2018-03-20 ENCOUNTER — Encounter: Payer: Self-pay | Admitting: Internal Medicine

## 2018-03-20 VITALS — BP 158/90 | HR 60 | Temp 97.8°F | Wt 196.0 lb

## 2018-03-20 DIAGNOSIS — R7309 Other abnormal glucose: Secondary | ICD-10-CM

## 2018-03-20 DIAGNOSIS — R7303 Prediabetes: Secondary | ICD-10-CM

## 2018-03-20 DIAGNOSIS — M25561 Pain in right knee: Secondary | ICD-10-CM | POA: Insufficient documentation

## 2018-03-20 LAB — POCT GLYCOSYLATED HEMOGLOBIN (HGB A1C): HbA1c, POC (controlled diabetic range): 6 % (ref 0.0–7.0)

## 2018-03-20 LAB — POCT UA - MICROALBUMIN
Albumin/Creatinine Ratio, Urine, POC: <30
Creatinine, POC: 200 mg/dL
Microalbumin Ur, POC: 30 mg/L

## 2018-03-20 MED ORDER — VITAMIN D (ERGOCALCIFEROL) 1.25 MG (50000 UNIT) PO CAPS: 50000.0000 [IU] | ORAL_CAPSULE | ORAL | 0 refills | Status: DC

## 2018-03-20 MED ORDER — TRIAMCINOLONE ACETONIDE 0.1 % EX CREA: TOPICAL_CREAM | CUTANEOUS | 0 refills | Status: DC

## 2018-03-20 NOTE — Patient Instructions (Addendum)
It was so nice to see you today!  Please schedule an appointment with your eye doctor as soon as possible.  I have ordered right knee x-rays for you. Please go to [facility] at [place] [place] [address] to have these done.  I will call you with those results.  -Dr. Brett Albino

## 2018-03-20 NOTE — Assessment & Plan Note (Signed)
Likely osteoarthritis, given that her pain is worse with humid weather and after she started walking at the gym. Also with medial and lateral joint line tenderness. Special testing is negative, so ligamentous injury is less likely. No erythema or warmth to suggest gout or septic arthritis. - Right knee x-ray ordered, as patient has never had any imaging of her knee - Discussed different treatment options with patient. She would like to try physical therapy. Referral placed. - Follow-up in the next 4-6 weeks

## 2018-03-20 NOTE — Progress Notes (Signed)
   [facility] Phone: [phone]  Subjective:  Lucindia is a 52 year old female presenting to clinic with right knee pain and for follow-up of her prediabetes.  Right Knee Pain: Has been going on for the last couple of months. Located in the anterior knee. Seems to be worse with the humid and rainy weather. She also feels like she has lost range of motion in her knee. Whenever she tries to extend her knee, she feels like she can't get it all the way flattened. She has been using heat and cold to the knee, which has been helping. She has also been using Advil, which has helped. She thinks the knee pain got worse recently when she started walking at the gym. She endorses occasional popping and giving out. No redness or swelling. She states that the knee feels "weak". No locking. No trauma or injury.  Prediabetes: Does not check her CBGs at home. Not on any diabetes meds. Has lost 7lbs since her last visit by going to the gym and watching what she eats. Denies polyuria or polydipsia.  ROS: See HPI for pertinent positives and negatives  Past Medical History- GERD, HLD, prediabetes  Family history reviewed for today's visit. No changes.  Social history- patient is a former smoker.  Objective: BP (!) 158/90   Pulse 60   Temp 97.8 F (36.6 C) (Oral)   Wt 196 lb (88.9 kg)   LMP 09/27/2014 (Approximate)   SpO2 92%   BMI 31.64 kg/m  Gen: NAD, alert, cooperative with exam HEENT: NCAT, EOMI, MMM Neck: FROM, supple CV: RRR, no murmur Resp: CTABL, no wheezes, normal work of breathing GI: SNTND, BS present, no guarding or organomegaly Right Knee: No erythema, edema, or gross deformity; no crepitus; mildly decreased extension in comparison to the right; +mild tenderness to palpation along the medial and lateral joint lines; valgus and varus stress tests negative; thessaly's negative; anterior and posterior drawer test negative Feet: No deformities, no ulcerations, no  other skin breakdown bilaterally; intact to touch and monofilament testing bilaterally; PT and DP pulses intact bilaterally Neuro: Alert and oriented, no gross deficits Skin: No rashes, no lesions Psych: Appropriate behavior  Assessment/Plan: Right Knee Pain: Likely osteoarthritis, given that her pain is worse with humid weather and after she started walking at the gym. Also with medial and lateral joint line tenderness. Special testing is negative, so ligamentous injury is less likely. No erythema or warmth to suggest gout or septic arthritis. - Right knee x-ray ordered, as patient has never had any imaging of her knee - Discussed different treatment options with patient. She would like to try physical therapy. Referral placed. - Follow-up in the next 4-6 weeks  Prediabetes: Well-controlled. A1c 6.0% today. - Discussed importance of regular exercise - Diabetic foot exam performed today and was normal - Urine microalbumin ordered today - Patient will schedule annual diabetic eye exam with her ophthalmologist  - Follow-up in 6 months for recheck of her A1c   Hyman Bible, MD PGY-3

## 2018-03-20 NOTE — Assessment & Plan Note (Signed)
Well-controlled. A1c 6.0% today. - Discussed importance of regular exercise - Diabetic foot exam performed today and was normal - Urine microalbumin ordered today - Patient will schedule annual diabetic eye exam with her ophthalmologist  - Follow-up in 6 months for recheck of her A1c

## 2018-03-22 ENCOUNTER — Telehealth: Payer: Self-pay | Admitting: *Deleted

## 2018-03-22 NOTE — Telephone Encounter (Signed)
Pt wants to know the results of her xrays.  Joyce Leckey, Salome Spotted, CMA

## 2018-03-22 NOTE — Telephone Encounter (Signed)
Returned patient's call. Discussed results of knee x-ray. All questions answered.  Hyman Bible, MD PGY-3

## 2018-04-10 ENCOUNTER — Ambulatory Visit: Payer: 59 | Admitting: Physical Therapy

## 2018-04-21 ENCOUNTER — Other Ambulatory Visit: Payer: Self-pay

## 2018-04-23 ENCOUNTER — Other Ambulatory Visit: Payer: Self-pay | Admitting: Family Medicine

## 2018-04-23 DIAGNOSIS — E559 Vitamin D deficiency, unspecified: Secondary | ICD-10-CM

## 2018-04-23 MED ORDER — VITAMIN D (ERGOCALCIFEROL) 1.25 MG (50000 UNIT) PO CAPS: 50000.0000 [IU] | ORAL_CAPSULE | ORAL | 0 refills | Status: DC

## 2018-04-23 NOTE — Progress Notes (Unsigned)
Refilled Vitamin D prescription. Ordered future Vitamin D level to be drawn per Dr. Velia Meyer previous notes. Please have patient make lab appointment. Thanks!  Rory Percy, DO PGY-2, [facility] 04/23/2018 5:39 PM

## 2018-04-25 ENCOUNTER — Other Ambulatory Visit: Payer: 59

## 2018-04-25 DIAGNOSIS — E559 Vitamin D deficiency, unspecified: Secondary | ICD-10-CM

## 2018-04-26 LAB — VITAMIN D 25 HYDROXY (VIT D DEFICIENCY, FRACTURES): Vit D, 25-Hydroxy: 19.2 ng/mL — ABNORMAL LOW (ref 30.0–100.0)

## 2018-07-05 ENCOUNTER — Ambulatory Visit (INDEPENDENT_AMBULATORY_CARE_PROVIDER_SITE_OTHER): Payer: 59 | Admitting: Family Medicine

## 2018-07-05 ENCOUNTER — Encounter: Payer: Self-pay | Admitting: Family Medicine

## 2018-07-05 ENCOUNTER — Other Ambulatory Visit: Payer: Self-pay

## 2018-07-05 VITALS — BP 122/78 | HR 50 | Temp 98.6°F | Wt 196.0 lb

## 2018-07-05 DIAGNOSIS — R252 Cramp and spasm: Secondary | ICD-10-CM

## 2018-07-05 DIAGNOSIS — M62838 Other muscle spasm: Secondary | ICD-10-CM

## 2018-07-05 DIAGNOSIS — E785 Hyperlipidemia, unspecified: Secondary | ICD-10-CM | POA: Diagnosis not present

## 2018-07-05 MED ORDER — CYCLOBENZAPRINE HCL 10 MG PO TABS: 5.0000 mg | ORAL_TABLET | Freq: Every evening | ORAL | 1 refills | Status: DC | PRN

## 2018-07-05 NOTE — Patient Instructions (Signed)
You have a bad muscle spasm there on the Right side of your neck.  This is pinching the nerves in your neck and upper arm.  Sleeping poorly or with bad posture will continue to worsen this.  Use the Flexeril at night to help with sleep and to help as a muscle relaxer.    Use a heating pad for 20 minutes at a time to help as well.  Massage is also very helpful.   For your cramps: - We are checking blood work and electrolytes today.  - If all this is normal, we will send you to a sports medicine doctor to see if there's anything else going on.  It was good to meet you today.

## 2018-07-05 NOTE — Progress Notes (Signed)
Subjective:    Cassandra Osborne is a 52 y.o. female who presents to [facility] today for neck/arm pain and leg pain:  1.  Right leg pain: Cramping in right medial thigh.  Present for the past several weeks.  She is concerned there is indicative of something worse or systemic.  Wakes her up at night.  Happens to 3 times a week.  She works on her feet and stands all day long.  She has not tried much for relief.  Also has right knee pain but this is a different pain.  No injuries.  2.  Right arm and neck pain: Present for the past week or so.  She has been sleeping on the couch because she has trouble sleeping in her bed.  Pain starts on right side of her neck and the right shoulder occasionally radiates to her right hand.  Feels radiation as numbness or tingling in her arm and hand.  No injury.  She has not tried much for relief.  The.  She is worried that it is related to her cramping.  No headaches.  No neck stiffness.  No recent illnesses.   ROS as above per HPI.   The following portions of the patient's history were reviewed and updated as appropriate: allergies, current medications, past medical history, family and social history, and problem list. Patient is a nonsmoker.    PMH reviewed.  Past Medical History:  Diagnosis Date  . High cholesterol   . No pertinent past medical history    Past Surgical History:  Procedure Laterality Date  . LUMBAR LAMINECTOMY/DECOMPRESSION MICRODISCECTOMY  09/18/2012   Procedure: LUMBAR LAMINECTOMY/DECOMPRESSION MICRODISCECTOMY 1 LEVEL;  Surgeon: Winfield Cunas, MD;  Location: [address] [facility];  Service: Neurosurgery;  Laterality: Left;  Left Lumbar Four-Five Diskectomy    Medications reviewed. Current Outpatient Medications  Medication Sig Dispense Refill  . diclofenac sodium (VOLTAREN) 1 % GEL Apply 4 g topically 4 (four) times daily. 100 g 0  . omeprazole (PRILOSEC) 40 MG capsule Take 1 capsule (40 mg total) by mouth daily. 90 capsule 0  . triamcinolone cream  (KENALOG) 0.1 % APPLY TO AFFECTED AREA SPARINGLY EVERY MORNING AS NEEDED IF FLARE, MAY USE TWICE A DAY 30 g 0  . Vitamin D, Ergocalciferol, (DRISDOL) 50000 units CAPS capsule Take 1 capsule (50,000 Units total) by mouth every 7 (seven) days. Sunday 5 capsule 0   No current facility-administered medications for this visit.      Objective:   Physical Exam BP 122/78 (BP Location: Left Arm)   Pulse (!) 50   Temp 98.6 F (37 C) (Oral)   Wt 196 lb (88.9 kg)   LMP 09/27/2014 (Approximate)   SpO2 99%   BMI 31.64 kg/m  Gen:  Alert, cooperative patient who appears stated age in no acute distress.  Vital signs reviewed. HEENT: EOMI,  MMM Cardiac:  Regular rate and rhythm without murmur auscultated.  Good S1/S2. Pulm:  Clear to auscultation bilaterally with good air movement.  No wheezes or rales noted.   MSK: Tender to palpation along the right paracervical neck muscles and right trapezius muscle.  She has palpable spasm noted here as well.  She has some stiffness when trying to look laterally to the right.  No decreased forward flexion or extension of her neck.  I am not able to reproduce the paresthesias or numbness to her right arm.  Strength is 5/5 bilateral upper and lower extremities.  No sensory deficits noted bilateral upper lower  extremities. Right leg: Good internal and external rotation of the hip without any pain.  Unable to elicit any pain or tenderness on examination of her entire leg.  No twitching or muscle cramping currently.  Impression/plan: 1.  Neck spasm: -See AVS for treatment. -Flexeril plus heat plus massage for relief. -No red flags.  2.  Leg cramps: -Checking blood work electrolytes. -She has had this for several months and it is causing some issues with her.  I believe it is likely secondary to general lack of exercise plus the fact she is on her feet most today. -Unable to find anything on examination.-She is worried and asking about specialty referral.  We can  consider sports medicine in the future if necessary more for reassurance of anything.  3.  HLD:  - Wants to have her cholesterol rechecked. -She is concerned that her cramping is because of cholesterol issues.  Discussed this.

## 2018-07-06 LAB — COMPREHENSIVE METABOLIC PANEL
ALBUMIN: 4.5 g/dL (ref 3.5–5.5)
ALT: 15 IU/L (ref 0–32)
AST: 12 IU/L (ref 0–40)
Albumin/Globulin Ratio: 2 (ref 1.2–2.2)
Alkaline Phosphatase: 93 IU/L (ref 39–117)
BUN/Creatinine Ratio: 14 (ref 9–23)
BUN: 10 mg/dL (ref 6–24)
Bilirubin Total: 0.3 mg/dL (ref 0.0–1.2)
CHLORIDE: 105 mmol/L (ref 96–106)
CO2: 25 mmol/L (ref 20–29)
CREATININE: 0.74 mg/dL (ref 0.57–1.00)
Calcium: 10.2 mg/dL (ref 8.7–10.2)
GFR calc non Af Amer: 92 mL/min/{1.73_m2} (ref >59)
GFR, EST AFRICAN AMERICAN: 106 mL/min/{1.73_m2} (ref >59)
GLUCOSE: 84 mg/dL (ref 65–99)
Globulin, Total: 2.2 g/dL (ref 1.5–4.5)
POTASSIUM: 3.8 mmol/L (ref 3.5–5.2)
SODIUM: 143 mmol/L (ref 134–144)
TOTAL PROTEIN: 6.7 g/dL (ref 6.0–8.5)

## 2018-07-06 LAB — CBC
Hematocrit: 40 % (ref 34.0–46.6)
Hemoglobin: 12.8 g/dL (ref 11.1–15.9)
MCH: 24.9 pg — ABNORMAL LOW (ref 26.6–33.0)
MCHC: 32 g/dL (ref 31.5–35.7)
MCV: 78 fL — ABNORMAL LOW (ref 79–97)
PLATELETS: 216 10*3/uL (ref 150–450)
RBC: 5.14 x10E6/uL (ref 3.77–5.28)
RDW: 13.8 % (ref 12.3–15.4)
WBC: 4.6 10*3/uL (ref 3.4–10.8)

## 2018-07-06 LAB — LIPID PANEL
CHOLESTEROL TOTAL: 212 mg/dL — AB (ref 100–199)
Chol/HDL Ratio: 4.7 ratio — ABNORMAL HIGH (ref 0.0–4.4)
HDL: 45 mg/dL (ref >39)
LDL Calculated: 149 mg/dL — ABNORMAL HIGH (ref 0–99)
TRIGLYCERIDES: 91 mg/dL (ref 0–149)
VLDL Cholesterol Cal: 18 mg/dL (ref 5–40)

## 2018-07-06 LAB — TSH: TSH: 1.69 u[IU]/mL (ref 0.450–4.500)

## 2018-07-09 ENCOUNTER — Encounter: Payer: Self-pay | Admitting: Family Medicine

## 2018-07-11 ENCOUNTER — Encounter: Payer: Self-pay | Admitting: Family Medicine

## 2018-07-14 ENCOUNTER — Telehealth: Payer: Self-pay

## 2018-07-14 NOTE — Telephone Encounter (Signed)
Will forward to provider who ordered labs.

## 2018-07-14 NOTE — Telephone Encounter (Signed)
Patient left message she is waiting on lab results from 07/05/18. Danley Danker, RN [facility] RN)

## 2018-07-17 NOTE — Telephone Encounter (Signed)
Nursing please call patient with results.  Her thyroid level is normal.  Her metabolic panel is also normal. Her blood count is normal.  Her cholesterol is improved from prior.  Her total cholesterol still slightly elevated.  At this time I recommend continuing a diet high leafy green vegetables, reducing red meat intake and reducing sugar sweet beverages.  She does not need medication at this time (ten year ASCVD risk is 3%). This is all good news. I am uncertain the cause of her leg cramps----please let me know if they are still bothering her.

## 2018-07-17 NOTE — Telephone Encounter (Signed)
Reviewed labs and medications again.  Cannot identify etiology for medication list.  Recommendations until she is seen in follow-up with her primary care doctor Dr. Ky Barban: -Massage legs for 3 to 5 minutes each night before bed -Trial of stretching both legs for 5 to 10 minutes before bed -Light exercise such as walking around the block, stationary bicycle for up to 10 minutes prior to bed is also demonstrated some benefit for nocturnal leg cramps - Ensuring she drinks 64 ounces of water each day - Avoid excess caffeine, alcohol, and tobacco products   Recommend she try this for a few weeks and schedule follow up if not improved.   Could consider checking ferritin level when she returns to see Dr. Ky Barban.

## 2018-07-17 NOTE — Telephone Encounter (Signed)
Informed pt of below and she did say that she is still having cramps, told her I would send to doctor and let her know in case there is something else the doctor would like to try or recommend for pt. Katharina Caper, Ansar Skoda D, [place]

## 2018-07-17 NOTE — Telephone Encounter (Signed)
LVM for pt to call office back to inform her of below. Zimmerman Rumple, Creed Kail D, CMA  

## 2018-07-21 NOTE — Telephone Encounter (Signed)
Pt informed of below. Zimmerman Rumple, April D, CMA  

## 2018-08-01 ENCOUNTER — Telehealth: Payer: Self-pay | Admitting: Family Medicine

## 2018-08-01 NOTE — Telephone Encounter (Signed)
Attempted to contact pt to remind them of their upcoming appt tomorrow 08/02/2018. -CH °

## 2018-08-02 ENCOUNTER — Other Ambulatory Visit: Payer: Self-pay

## 2018-08-02 ENCOUNTER — Ambulatory Visit (INDEPENDENT_AMBULATORY_CARE_PROVIDER_SITE_OTHER): Payer: 59 | Admitting: Family Medicine

## 2018-08-02 VITALS — BP 160/80 | HR 56 | Temp 98.1°F | Wt 199.0 lb

## 2018-08-02 DIAGNOSIS — B373 Candidiasis of vulva and vagina: Secondary | ICD-10-CM | POA: Diagnosis not present

## 2018-08-02 DIAGNOSIS — R399 Unspecified symptoms and signs involving the genitourinary system: Secondary | ICD-10-CM | POA: Diagnosis not present

## 2018-08-02 DIAGNOSIS — B3731 Acute candidiasis of vulva and vagina: Secondary | ICD-10-CM

## 2018-08-02 LAB — POCT URINALYSIS DIP (MANUAL ENTRY)
BILIRUBIN UA: NEGATIVE
Blood, UA: NEGATIVE
GLUCOSE UA: NEGATIVE mg/dL
Ketones, POC UA: NEGATIVE mg/dL
LEUKOCYTES UA: NEGATIVE
NITRITE UA: NEGATIVE
PH UA: 6 (ref 5.0–8.0)
Protein Ur, POC: NEGATIVE mg/dL
Spec Grav, UA: >=1.03 — AB (ref 1.010–1.025)
UROBILINOGEN UA: 0.2 U/dL

## 2018-08-02 LAB — POCT WET PREP (WET MOUNT)
Clue Cells Wet Prep Whiff POC: NEGATIVE
Trichomonas Wet Prep HPF POC: ABSENT

## 2018-08-02 MED ORDER — FLUCONAZOLE 150 MG PO TABS: 150.0000 mg | ORAL_TABLET | Freq: Once | ORAL | 0 refills | Status: AC

## 2018-08-02 MED ORDER — CLOTRIMAZOLE 1 % EX CREA: 1.0000 "application " | TOPICAL_CREAM | Freq: Two times a day (BID) | CUTANEOUS | 0 refills | Status: DC

## 2018-08-02 NOTE — Patient Instructions (Addendum)
Take augmentin today to  Complete a 5 day course of treatment for UTI.  Take diflucan for your yeast infection. You can use the cream on the outside for relief.   Drink plenty of water.

## 2018-08-02 NOTE — Progress Notes (Signed)
Subjective:  Cassandra Osborne is a 52 y.o. female who presents to the [facility] today with a chief complaint of UTI symptoms.   HPI:  Her back was hurting for the past 2-3 weeks, normally it eases up on its own, still it continued so she thought she thought she might have a UTI. She did have dysuria at the time, which was 1.5 weeks ago. No hematuria.  She went to [facility] last week and got an at home kit that told her she did have a UTI  so she took some leftover augmentin 875-136m q12 hours. She took 8 doses, last dose was yesterday. Felt like it wasn't strong enough because has continued to have back pain. No fever/chills, nausea/vomiting. Still has dysuria. She subsequently developed yeast infection symptoms very soon after starting augmentin, which is typical for her whenever she takes and antibiotic.    ROS: Per HPI  Social Hx: She reports that she has quit smoking. Her smoking use included cigarettes. She has never used smokeless tobacco. She reports that she does not drink alcohol or use drugs.   Objective:  Physical Exam: BP (!) 160/80   Pulse (!) 56   Temp 98.1 F (36.7 C) (Oral)   Wt 199 lb (90.3 kg)   LMP 09/27/2014 (Approximate)   SpO2 100%   BMI 32.12 kg/m   Gen: NAD, resting comfortably CV: RRR with no murmurs appreciated Pulm: NWOB, CTAB with no crackles, wheezes, or rhonchi GI: Normal bowel sounds present. Soft, Nontender, Nondistended. No CVA tenderness MSK: no edema, cyanosis, or clubbing noted Skin: warm, dry Neuro: grossly normal, moves all extremities Psych: Normal affect and thought content  Results for orders placed or performed in visit on 08/02/18 (from the past 72 hour(s))  POCT urinalysis dipstick     Status: Abnormal   Collection Time: 08/02/18  1:55 PM  Result Value Ref Range   Color, UA yellow yellow   Clarity, UA cloudy (A) clear   Glucose, UA negative negative mg/dL   Bilirubin, UA negative negative   Ketones, POC UA negative negative mg/dL   Spec Grav, UA >=1.030 (A) 1.010 - 1.025   Blood, UA negative negative   pH, UA 6.0 5.0 - 8.0   Protein Ur, POC negative negative mg/dL   Urobilinogen, UA 0.2 0.2 or 1.0 E.U./dL   Nitrite, UA Negative Negative   Leukocytes, UA Negative Negative  POCT Wet Prep ([facility])     Status: Abnormal   Collection Time: 08/02/18  2:05 PM  Result Value Ref Range   Source Wet Prep POC VAG    WBC, Wet Prep HPF POC OCCASIONAL    Bacteria Wet Prep HPF POC Few Few   Clue Cells Wet Prep HPF POC None None   Clue Cells Wet Prep Whiff POC Negative Whiff    Yeast Wet Prep HPF POC Few (A) None   Trichomonas Wet Prep HPF POC Absent Absent     Assessment/Plan:  1. UTI symptoms Patient had dysuria and back pain that she partially treated at home with a 4-day course of Augmentin.  Her UA today does not show any signs of UTI.  However since her last dose of her Augmentin was yesterday advised that she complete a 5-day course with the last dose today - POCT urinalysis dipstick  2. Yeast infection of the vagina Wet prep is consistent with yeast likely as a result of patient's home antibiotic use.  This may be causing external irritation around her urethra  giving her symptoms. - POCT Wet Prep ([facility]) - fluconazole (DIFLUCAN) 150 MG tablet; Take 1 tablet (150 mg total) by mouth once for 1 dose.  Dispense: 1 tablet; Refill: 0 - clotrimazole (LOTRIMIN) 1 % cream; Apply 1 application topically 2 (two) times daily.  Dispense: 30 g; Refill: 0  Bufford Lope, DO PGY-3, [facility] 08/02/2018 2:14 PM

## 2018-08-11 ENCOUNTER — Encounter: Payer: Self-pay | Admitting: *Deleted

## 2018-08-11 ENCOUNTER — Telehealth: Payer: Self-pay | Admitting: Family Medicine

## 2018-08-11 MED ORDER — FLUCONAZOLE 150 MG PO TABS: 150.0000 mg | ORAL_TABLET | Freq: Once | ORAL | 0 refills | Status: AC

## 2018-08-11 NOTE — Telephone Encounter (Signed)
Pt is calling and was seen for a uti last week. She was given a 150mg  of Diflucan and she says it did not work. She would like to know if she could have another one sent to her pharmacy. Pt would like for someone to call her when this has been done. [phone]

## 2018-08-11 NOTE — Telephone Encounter (Signed)
Left message for patient that 1 more diflucan sent to pharmacy but that if she continues to be symptomatic on Monday that she could be seen again for reeval.

## 2018-08-11 NOTE — Telephone Encounter (Signed)
Will forward to Dr. Shawna Orleans who saw patient for appt last week.   ,CMA

## 2018-09-08 ENCOUNTER — Other Ambulatory Visit: Payer: Self-pay | Admitting: Family Medicine

## 2018-10-03 ENCOUNTER — Encounter: Payer: Self-pay | Admitting: Family Medicine

## 2018-10-03 ENCOUNTER — Other Ambulatory Visit: Payer: Self-pay

## 2018-10-03 ENCOUNTER — Ambulatory Visit (INDEPENDENT_AMBULATORY_CARE_PROVIDER_SITE_OTHER): Payer: 59 | Admitting: Family Medicine

## 2018-10-03 DIAGNOSIS — I10 Essential (primary) hypertension: Secondary | ICD-10-CM

## 2018-10-03 DIAGNOSIS — R21 Rash and other nonspecific skin eruption: Secondary | ICD-10-CM

## 2018-10-03 DIAGNOSIS — J22 Unspecified acute lower respiratory infection: Secondary | ICD-10-CM | POA: Diagnosis not present

## 2018-10-03 MED ORDER — CLOTRIMAZOLE-BETAMETHASONE 1-0.05 % EX CREA: 1.0000 "application " | TOPICAL_CREAM | Freq: Two times a day (BID) | CUTANEOUS | 0 refills | Status: DC

## 2018-10-03 MED ORDER — AMLODIPINE BESYLATE 5 MG PO TABS: 5.0000 mg | ORAL_TABLET | Freq: Every day | ORAL | 3 refills | Status: DC

## 2018-10-03 MED ORDER — AZITHROMYCIN 250 MG PO TABS: ORAL_TABLET | ORAL | 0 refills | Status: DC

## 2018-10-03 MED ORDER — BENZONATATE 100 MG PO CAPS: 100.0000 mg | ORAL_CAPSULE | Freq: Two times a day (BID) | ORAL | 0 refills | Status: DC | PRN

## 2018-10-03 NOTE — Patient Instructions (Signed)
Come back in the next 2 - 4 weeks to have your blood pressure rechecked.    I have sent in the azithromycin for you.  I believe you have a cold.  If you are still not feeling well later this week you can start this.  I have also sent in the Tessalon perles for cough for you.  Take this once in the AM and once in the PM as needed.    Use the Lotrisone on your itchy areas.  This is a cream.

## 2018-10-03 NOTE — Progress Notes (Signed)
Subjective:    Cassandra Osborne is a 53 y.o. female who presents to [facility] today for URI symptoms with cough:  1.  URI symptoms with cough:  Present for over a week.  Initially started as runny nose and congestion but quickly developed productive cough.  Is been coughing up green sputum for the past several days.  Subjective fevers at home.  She has not measured her temperature.  Chills at nighttime.  No history of asthma or other COPD/lung issues.  Cough is her major symptom.  She tried some over-the-counter Mucinex without much relief.  She is not taking any thing else or anything that would raise her blood pressure.  2.  Elevated blood pressures: Patient has history of hypertension in the past which she took medications.  She has not been on medications for quite some time.  She states that at home her blood pressures been running higher than usual.  No chest pain.  No lower extremity edema.  No dyspnea on exertion.  She is asking if she needs medication today.  No HA's.  States that her home blood pressures have been running in the 951O to 841Y systolic and wants as high as 190 although this was not sustained.  3.  Rash: Circular rash on right forearm, left breast, right thigh.  Present for at least the past several months if not longer.  She has been followed at dermatology for this.  She had a biopsy but does not know the results.  She was never treated with anything for this.  She has not tried anything herself except for over-the-counter lotions which have not provided any improvement.  It is itchy.  Precedes her current illness.   ROS as above per HPI.    The following portions of the patient's history were reviewed and updated as appropriate: allergies, current medications, past medical history, family and social history, and problem list. Patient is a nonsmoker.    PMH reviewed.  Past Medical History:  Diagnosis Date  . High cholesterol   . No pertinent past medical history    Past  Surgical History:  Procedure Laterality Date  . LUMBAR LAMINECTOMY/DECOMPRESSION MICRODISCECTOMY  09/18/2012   Procedure: LUMBAR LAMINECTOMY/DECOMPRESSION MICRODISCECTOMY 1 LEVEL;  Surgeon: Winfield Cunas, MD;  Location: [facility];  Service: Neurosurgery;  Laterality: Left;  Left Lumbar Four-Five Diskectomy    Medications reviewed. Current Outpatient Medications  Medication Sig Dispense Refill  . clotrimazole (LOTRIMIN) 1 % cream Apply 1 application topically 2 (two) times daily. 30 g 0  . cyclobenzaprine (FLEXERIL) 10 MG tablet Take 0.5 tablets (5 mg total) by mouth at bedtime as needed for muscle spasms. 30 tablet 1  . diclofenac sodium (VOLTAREN) 1 % GEL Apply 4 g topically 4 (four) times daily. 100 g 0  . omeprazole (PRILOSEC) 40 MG capsule Take 1 capsule (40 mg total) by mouth daily. 90 capsule 0  . triamcinolone cream (KENALOG) 0.1 % APPLY TO AFFECTED AREA SPARINGLY EVERY MORNING AS NEEDED IF FLARE, MAY USE TWICE A DAY 30 g 0  . Vitamin D, Ergocalciferol, (DRISDOL) 1.25 MG (50000 UT) CAPS capsule TAKE 1 CAPSULE (50,000 UNITS TOTAL) BY MOUTH EVERY 7 (SEVEN) DAYS. SUNDAY 5 capsule 0   No current facility-administered medications for this visit.      Objective:   Physical Exam BP (!) 172/90   Pulse (!) 51   Temp 98 F (36.7 C) (Oral)   Ht 5\' 6"  (1.676 m)   Wt 200 lb 3.2  oz (90.8 kg)   LMP 09/27/2014 (Approximate)   SpO2 99%   BMI 32.31 kg/m  Gen:  Patient sitting on exam table, appears stated age in no acute distress Head: Normocephalic atraumatic Eyes: EOMI, PERRL, sclera and conjunctiva non-erythematous Ears:  Canals clear bilaterally.  TMs pearly gray bilaterally without erythema or bulging.   Nose:  Nasal turbinates grossly enlarged bilaterally. Some exudates noted. Tender to palpation of maxillary sinus  Mouth: Mucosa membranes moist. Tonsils +2, nonenlarged, non-erythematous. Neck: No cervical lymphadenopathy noted Heart:  RRR, no murmurs auscultated. Pulm:  Clear  to auscultation bilaterally with good air movement.  No wheezes or rales noted.   Abd:  S/ND/NT Skin: Annular erythematous rash on the volar aspect of her right forearm.  No surrounding.  Also a similar rash on her left chest.  I did not examine the rash on her right thigh based on patient preference as she was wearing pants and she reported it to be the same.   Ext:  No LE edema.

## 2018-10-04 ENCOUNTER — Encounter: Payer: Self-pay | Admitting: Family Medicine

## 2018-10-04 DIAGNOSIS — J22 Unspecified acute lower respiratory infection: Secondary | ICD-10-CM | POA: Insufficient documentation

## 2018-10-04 DIAGNOSIS — J069 Acute upper respiratory infection, unspecified: Secondary | ICD-10-CM | POA: Insufficient documentation

## 2018-10-04 DIAGNOSIS — R21 Rash and other nonspecific skin eruption: Secondary | ICD-10-CM | POA: Insufficient documentation

## 2018-10-04 DIAGNOSIS — I10 Essential (primary) hypertension: Secondary | ICD-10-CM | POA: Insufficient documentation

## 2018-10-04 NOTE — Assessment & Plan Note (Signed)
Present for a long period of time.  After talking with her further she states it can come and go.  Right forearm is most bothersome.  Does not appear to be straightforward eczema.  Possibly tinea corporis.  Treat with Lotrisone.  She can follow-up in the next visit for her blood pressure to see how her rash is doing.  Would assume it will improve with the Lotrisone, at least the itchiness should.

## 2018-10-04 NOTE — Assessment & Plan Note (Signed)
Ongoing symptoms for over a week, not improving.  Concern possibly for secondary bacterial infection.  Lungs sound good though.  - Tessalon perles to treat - Delayed abx script.  See AVS.  Can start if no improvement in several days.  - To try over the counter decongestant.  Wrote these in pen on AVS.

## 2018-10-04 NOTE — Assessment & Plan Note (Signed)
New diagnosis is visit.  Based on elevated home blood pressures as well as elevated blood pressures over the past several months here in clinic. -Starting her on amlodipine. -Follow-up in 2 to 4 weeks after her current illness is resolved to see if she needs a medication adjustments.

## 2018-12-13 ENCOUNTER — Encounter: Payer: Self-pay | Admitting: Family Medicine

## 2018-12-13 ENCOUNTER — Telehealth: Payer: Self-pay | Admitting: Family Medicine

## 2018-12-13 NOTE — Telephone Encounter (Signed)
Letter routed to [employer].

## 2018-12-13 NOTE — Telephone Encounter (Signed)
Pt is calling and would like to know if she can have a doctors note written to be excused for two week quarantine from work. Pt works for the school system and was told that if there is underlining health issue they could be excused with a note from the doctor stating these issues. Pt is worried about her high blood pressure and the amount of stress.   Pt would like to pick the note up in the office. Please call pt to let her know if this is a possible.

## 2018-12-28 ENCOUNTER — Other Ambulatory Visit: Payer: Self-pay

## 2018-12-28 ENCOUNTER — Telehealth: Payer: Self-pay

## 2018-12-28 ENCOUNTER — Telehealth (INDEPENDENT_AMBULATORY_CARE_PROVIDER_SITE_OTHER): Payer: 59 | Admitting: Family Medicine

## 2018-12-28 DIAGNOSIS — M5431 Sciatica, right side: Secondary | ICD-10-CM | POA: Diagnosis not present

## 2018-12-28 MED ORDER — HYDROCODONE-ACETAMINOPHEN 5-325 MG PO TABS: 1.0000 | ORAL_TABLET | Freq: Four times a day (QID) | ORAL | 0 refills | Status: DC | PRN

## 2018-12-28 NOTE — Telephone Encounter (Signed)
Spoke with pt informing her that her work note is at the front for pick up. Pt understood said she may come by tomorrow to get it. Salvatore Marvel, CMA

## 2018-12-28 NOTE — Progress Notes (Signed)
[facility] Visit  Patient consented to have visit conducted via telephone.  Encounter participants: Patient: Cassandra Osborne  Provider: Sherren Mocha Reine [place]  Others (if applicable): none No answer [phone] nor [phone].  Left message on voice message.  Was able Contact pt at her work site.  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 Pattern: constant Course: worsening Radiation: down back and front of right leg to right foot Relief: no relief with Aleve / Tylenol Precipitant: No recent trauma Associated Symptoms:       Restricted ROM/stiffness/swelling:  no       Muscle ache/cramp/spasms: yes       Color or temperature change: no       Muscle strength change: right leg feels weaker ;than left       Change in sensation (dysesthesia/itch or numbness): no numbness Functional Impact            Change in work activities: pain intereferes with walking which is necessary part of her job               Last time worked: working today       Change in sleep from pain: both DFA and EMA.  Pain is causing these difficulties.      Trauma (Acute or Chronic): No   PSH: Prior laminectomy with microdiscectomy 08/2012.   ROS:  See HPI   Assessment/Plan:  Sciatica of right side New problem Acute onset Prior hx of lumbar laminectory/discectomy Worse pain in right buttock with radiation down to foot  Ddx: HNP with root compression Vs sciatic notch muscle process Vs musculoskeletal process  PLan Discussed having pt come in tomorrow for evaluation Vs symptomatic treatment and relative rest Off work tomorrow and weekend.  Note put at front desk for pick up.   Pt to call if pain not improved by 01/01/19 for evaluation in Acute Care clinic.  Pt to call if pain worsens or weakness develops or  urinary incontinence. Rx hydrocodone-APAP 5/325 #20 disp    Time spent on phone with patient: 10 minutes

## 2018-12-28 NOTE — Assessment & Plan Note (Addendum)
New problem Acute onset Prior hx of lumbar laminectory/discectomy Worse pain in right buttock with radiation down to foot  Ddx: HNP with root compression Vs sciatic notch muscle process Vs musculoskeletal process  PLan Discussed having pt come in tomorrow for evaluation Vs symptomatic treatment and relative rest Off work tomorrow and weekend.  Note put at front desk for pick up.   Pt to call if pain not improved by 01/01/19 for evaluation in Acute Care clinic.  Pt to call if pain worsens or weakness develops or urinary incontinence. Rx hydrocodone-APAP 5/325 #20 disp

## 2019-01-03 ENCOUNTER — Other Ambulatory Visit: Payer: Self-pay

## 2019-01-03 ENCOUNTER — Ambulatory Visit (INDEPENDENT_AMBULATORY_CARE_PROVIDER_SITE_OTHER): Payer: 59 | Admitting: Family Medicine

## 2019-01-03 VITALS — BP 138/82 | HR 56 | Temp 98.6°F | Wt 199.4 lb

## 2019-01-03 DIAGNOSIS — M5431 Sciatica, right side: Secondary | ICD-10-CM

## 2019-01-03 DIAGNOSIS — M5416 Radiculopathy, lumbar region: Secondary | ICD-10-CM | POA: Diagnosis not present

## 2019-01-03 MED ORDER — GABAPENTIN 100 MG PO CAPS: 100.0000 mg | ORAL_CAPSULE | Freq: Three times a day (TID) | ORAL | 3 refills | Status: DC

## 2019-01-03 MED ORDER — DICLOFENAC SODIUM 75 MG PO TBEC: 75.0000 mg | DELAYED_RELEASE_TABLET | Freq: Two times a day (BID) | ORAL | 0 refills | Status: AC

## 2019-01-03 NOTE — Progress Notes (Signed)
Established Patient - Clinic Visit  Subjective:  PCP: Rory Percy, DO Patient ID: MRN [id number]  Date of birth: Feb 28, 1966  CC: Right leg pain    HPI:  Cassandra Osborne is a 53 y.o. female with pmhx s/f discectomy in 2013 and hx of leg cramps (seen by Dr. Mingo Amber in October 2019 - XR reveled right knee arthritis) who presents with right buttock pain that travels down her leg to her feet for the last two weeks. She has pain with walking, with certain movements, with lying down and with stting down. She has been unable to sleep due to the discomfort and has been waking during the night. The pain is not sharp, but dull and sudden and sometimes causing a aching in all of her leg muscles. She also complains that the anterior lower leg "feels weird".  She denies saddle anasthesia, significant weakness in lower extremities, bowel or bladder incontinence, loss of feeling in the pelvic or perianal area, gait difficulties. She denies any recent trauma or changes in activities.   ROS: See HPI Meds & Allg: Reviewed with patient and updated in EMR as appropriate.   Past surgical hx: simple discectomy and laminectomy for herniated lumbar disc L4/5 left on 09/18/2012 by Dr. Ashok Pall  Objective:  Physical Exam:  BP 138/82   Pulse (!) 56   Temp 98.6 F (37 C) (Oral)   Wt 199 lb 6.4 oz (90.4 kg)   LMP 09/27/2014 (Approximate)   SpO2 98%   BMI 32.18 kg/m   General: nontoxic, well appearing. Tearful.  MSK: No TTP to lumbar spine, iliac crest. TTP to soft tissue just below right iliac crest. No masses or abnormalities palpated. Pain is elicited with straight leg raise. No pain with knee flexion and external hip rotation. TTP to soft tissue up anterior upper leg, anterior and posterior lower leg. Pain elicited with active dorsiflexion of right foot.  Neuro:  Patient has full sensation throughout, but does report numb-like feeling with big toe. Gait is normal with no hip drop or foot drop. Patient  has moderate pronation. 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 McDiarmid on 4/2. Patient continues to have pain. She is tearful today in clinic and is frustrated with the pain. Reports vicodin is helpful for the pain, but she would like to address the cause.  DDx: likely nerve compression of right side due to either herniation of disc or possible narrowing/bone spur in setting of discectomy in 2013. Since patient is experiencing symptoms in the entirety of her leg, more likely injury closer to nerve exit. May also consider nerve irritation due to glut muscular spasm.  Will obtain Lumbar radiograph today -- No MRI at this time (insurance unlikely to cover without prior imaging & will defer imaging to neurosurgeon if no improvement)  Pt encouraged to limit vicodin use   Provided gabapentin and rx strength NSAID with PT referral  If patient without improvement with medications and PT exercises, patient is encouraged to return to neurosurgeon (Dr. Ashok Pall) for recommended imaging and further evaluation.   Work note provided per request of patient   - DG Lumbar Spine Complete; Future - Ambulatory referral to Physical Therapy  Other orders - gabapentin (NEURONTIN) 100 MG capsule; Take 1 capsule (100 mg total) by mouth 3 (three) times daily. 100mg  at  night for first 3 days, then 100mg  three times daily. - diclofenac (VOLTAREN) 75 MG EC tablet; Take 1 tablet (75 mg total) by mouth 2 (two) times daily for 10 days.   Zettie Cooley, M.D. [place]  PGY -1 01/05/2019, 11:34 AM

## 2019-01-03 NOTE — Patient Instructions (Addendum)
Dear Cassandra Osborne,   It was very nice to see you! Thank you for taking your time to come in to be seen. Today, we discussed the following:   Hip and Leg Pain    Medication directions   Gabapentin  Please start by taking 100mg  each night for 3 days.   Then increase to 100mg  three times daily as needed for one week.   Then increase to 300mg  three times daily as needed   Diclofenac   75mg  twice daily as needed for pain   Please take the Vicodin sparingly   If you are still in pain, we can consider a prednisone burst, which can be helpful for your pain, but is not a first line treatment   Imaging:   Sending you to go to [facility] for Lumbar x rays.   Physical Therapy   Referral to physical therapy for exercises to do at home to increase flexibility   If you still have issues after all of these interventions, we encourage you to make an appointment with your back surgeon to be seen again for evaluation and to determine the best imaging for you.   Provided work note   Please call or make an appointment for concerning or worsening symptoms.  Be well,   Dr. Zettie Cooley [facility] [phone]   Sign up for MyChart for instant access to your health profile, labs, orders, upcoming appointments or to contact your provider with questions.    Lumbosacral Radiculopathy Lumbosacral radiculopathy is a condition that involves the spinal nerves and nerve roots in the low back and bottom of the spine. The condition develops when these nerves and nerve roots move out of place or become inflamed and cause symptoms. What are the causes? This condition may be caused by:  Pressure from a disk that bulges out of place (herniated disk). A disk is a plate of soft cartilage that separates bones in the spine.  Disk changes that occur with age (disk degeneration).  A narrowing of the bones of the lower back (spinal stenosis).  A tumor.  An infection.  An  injury that places sudden pressure on the disks that cushion the bones of your lower spine. What increases the risk? You are more likely to develop this condition if:  You are a female who is 18-69 years old.  You are a female who is 88-14 years old.  You use improper technique when lifting things.  You are overweight or live a sedentary lifestyle.  Your work requires frequent lifting.  You smoke.  You do repetitive activities that strain the spine. What are the signs or symptoms? Symptoms of this condition include:  Pain that goes down from your back into your legs (sciatica), usually on one side of the body. This is the most common symptom. The pain may be worse with sitting, coughing, or sneezing.  Pain and numbness in your legs.  Muscle weakness.  Tingling.  Loss of bladder control or bowel control. How is this diagnosed? This condition may be diagnosed based on:  Your symptoms and medical history.  A physical exam. If the pain is lasting, you may have tests, such as:  MRI scan.  X-ray.  CT scan.  A type of X-ray used to examine the spinal canal after injecting a dye into your spine (myelogram).  A test to measure how electrical impulses move through a nerve (nerve conduction study). How is this treated? Treatment may depend on the cause  of the condition and may include:  Working with a physical therapist.  Taking pain medicine.  Applying heat and ice to affected areas.  Doing stretches to improve flexibility.  Doing exercises to strengthen back muscles.  Having chiropractic spinal manipulation.  Using transcutaneous electrical nerve stimulation (TENS) therapy.  Getting a steroid injection in the spine. In some cases, no treatment is needed. If the condition is long-lasting (chronic), or if symptoms are severe, treatment may involve surgery or lifestyle changes, such as following a weight-loss plan. Follow these instructions at  home: Activity  Avoid bending and other activities that make the problem worse.  Maintain a proper position when standing or sitting: ? When standing, keep your upper back and neck straight, with your shoulders pulled back. Avoid slouching. ? When sitting, keep your back straight and relax your shoulders. Do not round your shoulders or pull them backward.  Do not sit or stand in one place for long periods of time.  Take brief periods of rest throughout the day. This will reduce your pain. It is usually better to rest by lying down or standing, not sitting.  When you are resting for longer periods, mix in some mild activity or stretching between periods of rest. This will help to prevent stiffness and pain.  Get regular exercise. Ask your health care provider what activities are safe for you. If you were shown how to do any exercises or stretches, do them as directed by your health care provider.  Do not lift anything that is heavier than 10 lb (4.5 kg) or the limit that you are told by your health care provider. Always use proper lifting technique, which includes: ? Bending your knees. ? Keeping the load close to your body. ? Avoiding twisting. Managing pain  If directed, put ice on the affected area: ? Put ice in a plastic bag. ? Place a towel between your skin and the bag. ? Leave the ice on for 20 minutes, 2-3 times a day.  If directed, apply heat to the affected area as often as told by your health care provider. Use the heat source that your health care provider recommends, such as a moist heat pack or a heating pad. ? Place a towel between your skin and the heat source. ? Leave the heat on for 20-30 minutes. ? Remove the heat if your skin turns bright red. This is especially important if you are unable to feel pain, heat, or cold. You may have a greater risk of getting burned.  Take over-the-counter and prescription medicines only as told by your health care provider. General  instructions  Sleep on a firm mattress in a comfortable position. Try lying on your side with your knees slightly bent. If you lie on your back, put a pillow under your knees.  Do not drive or use heavy machinery while taking prescription pain medicine.  If your health care provider prescribed a diet or exercise program, follow it as directed.  Keep all follow-up visits as told by your health care provider. This is important. Contact a health care provider if:  Your pain does not improve over time, even when taking pain medicines. Get help right away if:  You develop severe pain.  Your pain suddenly gets worse.  You develop increasing weakness in your legs.  You lose the ability to control your bladder or bowel.  You have difficulty walking or balancing.  You have a fever. Summary  Lumbosacral radiculopathy is a condition that  occurs when the spinal nerves and nerve roots in the lower part of the spine move out of place or become inflamed and cause symptoms.  Symptoms include pain, numbness, and tingling that go down from your back into your legs (sciatica), muscle weakness, and loss of bladder control or bowel control.  If directed, apply ice or heat to the affected area as told by your health care provider.  Follow instructions about activity, rest, and proper lifting technique. This information is not intended to replace advice given to you by your health care provider. Make sure you discuss any questions you have with your health care provider. Document Released: 09/13/2005 Document Revised: 09/01/2017 Document Reviewed: 09/01/2017 Elsevier Interactive Patient Education  2019 [employer].

## 2019-01-05 ENCOUNTER — Encounter: Payer: Self-pay | Admitting: Family Medicine

## 2019-01-05 DIAGNOSIS — M5416 Radiculopathy, lumbar region: Secondary | ICD-10-CM | POA: Insufficient documentation

## 2019-01-05 NOTE — Assessment & Plan Note (Addendum)
Addressed via phone call with Dr. Wendy Poet on 4/2. Patient continues to have pain. She is tearful today in clinic and is frustrated with the pain. Reports vicodin is helpful for the pain, but she would like to address the cause.  DDx: likely nerve compression of right side due to either herniation of disc or possible narrowing/bone spur in setting of discectomy in 2013. Since patient is experiencing symptoms in the entirety of her leg, more likely injury closer to nerve exit. May also consider nerve irritation due to glut muscular spasm.  Will obtain Lumbar radiograph today -- No MRI at this time (insurance unlikely to cover without prior imaging & will defer imaging to neurosurgeon if no improvement)  Pt encouraged to limit vicodin use   Provided gabapentin and rx strength NSAID with PT referral  If patient without improvement with medications and PT exercises, patient is encouraged to return to neurosurgeon (Dr. Ashok Pall) for recommended imaging and further evaluation.   Work note provided per request of patient   - DG Lumbar Spine Complete; Future - Ambulatory referral to Physical Therapy  Other orders - gabapentin (NEURONTIN) 100 MG capsule; Take 1 capsule (100 mg total) by mouth 3 (three) times daily. 100mg  at night for first 3 days, then 100mg  three times daily. - diclofenac (VOLTAREN) 75 MG EC tablet; Take 1 tablet (75 mg total) by mouth 2 (two) times daily for 10 days.

## 2019-01-19 ENCOUNTER — Other Ambulatory Visit: Payer: Self-pay

## 2019-01-19 ENCOUNTER — Telehealth: Payer: Self-pay | Admitting: Family Medicine

## 2019-01-19 ENCOUNTER — Encounter: Payer: Self-pay | Admitting: Family Medicine

## 2019-01-19 ENCOUNTER — Ambulatory Visit
Admission: RE | Admit: 2019-01-19 | Discharge: 2019-01-19 | Disposition: A | Discharge status: Home / Self Care | Payer: 59 | Source: Ambulatory Visit | Attending: Family Medicine | Admitting: Family Medicine

## 2019-01-19 DIAGNOSIS — M5416 Radiculopathy, lumbar region: Secondary | ICD-10-CM

## 2019-01-19 NOTE — Telephone Encounter (Signed)
Based on evaluation of previous providers, letter provided and routed to [facility].

## 2019-01-19 NOTE — Telephone Encounter (Signed)
Pt called to see if she could receive a note stating that she can return to work for her full amount of hours (5) instead of 3 because she's feeling better, and seating down. Pt stating that she would like to return back to work on Wednesday (04-29 ).Please give pt a call.

## 2019-02-23 ENCOUNTER — Telehealth: Payer: Self-pay | Admitting: *Deleted

## 2019-02-23 DIAGNOSIS — M47816 Spondylosis without myelopathy or radiculopathy, lumbar region: Secondary | ICD-10-CM

## 2019-02-23 NOTE — Telephone Encounter (Signed)
Looks relatively ok with some degenerative changes but will forward to provider who saw her for that visit.

## 2019-02-23 NOTE — Telephone Encounter (Signed)
Pt is requesting results of back xray. Christen Bame, CMA

## 2019-02-23 NOTE — Telephone Encounter (Addendum)
Spoke to patient over the phone about findings from most recent spine x-ray.  Patient has osteoarthritic changes at L4 and L5 as well as L5 and S1 with narrowing between discs greatest at L4-L5.  No other significant findings.  Patient is agreeable for physical therapy for lower back pain.  She says her back pain has been well controlled over the last 2 months but it did start hurting yesterday.  She has been to taking the medication provided during her last visit and reports that that is helpful.  Patient will continue to monitor her symptoms and follow-up with [place] if she has any further concerns.  Since patient has previous neurosurgical interventions, she can also reach out to her neurosurgeon for any treatment recommendations.  PT referral  Zettie Cooley, M.D.  Family Medicine  PGY-1 02/23/2019 3:53 PM

## 2019-02-25 ENCOUNTER — Other Ambulatory Visit: Payer: Self-pay | Admitting: Family Medicine

## 2019-02-26 NOTE — Telephone Encounter (Signed)
Please have patient make appt to follow up on blood pressure and diabetes. Refill sent.

## 2019-03-05 ENCOUNTER — Encounter: Payer: Self-pay | Admitting: Physical Therapy

## 2019-03-05 ENCOUNTER — Ambulatory Visit: Payer: 59 | Attending: Family Medicine | Admitting: Physical Therapy

## 2019-03-05 ENCOUNTER — Other Ambulatory Visit: Payer: Self-pay

## 2019-03-05 DIAGNOSIS — M25561 Pain in right knee: Secondary | ICD-10-CM | POA: Diagnosis present

## 2019-03-05 DIAGNOSIS — G8929 Other chronic pain: Secondary | ICD-10-CM | POA: Insufficient documentation

## 2019-03-05 DIAGNOSIS — M5441 Lumbago with sciatica, right side: Secondary | ICD-10-CM | POA: Insufficient documentation
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
## 2019-03-05 NOTE — Patient Instructions (Signed)
Access Code: [email or web address]  URL: [email or web address]  Date: 03/05/2019  Prepared by: Elsie Ra   Exercises  Supine Piriformis Stretch - 3 reps - 1 sets - 30 hold - 2x daily - 6x weekly  Supine Lower Trunk Rotation - 2-3 reps - 1 sets - 10 hold - 2x daily - 6x weekly  Standing Lumbar Extension - 10 reps - 1-2 sets - 5 hold - 2x daily - 6x weekly  Supine Posterior Pelvic Tilt - 10 reps - 2 sets - 5 hold - 2x daily - 6x weekly  Slump Stretch - 10 reps - 1-2 sets - 3 hold - 2x daily - 6x weekly

## 2019-06-12 ENCOUNTER — Other Ambulatory Visit: Payer: Self-pay

## 2019-06-12 ENCOUNTER — Encounter: Payer: Self-pay | Admitting: Family Medicine

## 2019-06-12 ENCOUNTER — Ambulatory Visit (INDEPENDENT_AMBULATORY_CARE_PROVIDER_SITE_OTHER): Payer: 59 | Admitting: Family Medicine

## 2019-06-12 VITALS — BP 130/68 | HR 54 | Ht 66.0 in | Wt 198.1 lb

## 2019-06-12 DIAGNOSIS — R2689 Other abnormalities of gait and mobility: Secondary | ICD-10-CM

## 2019-06-12 DIAGNOSIS — R1013 Epigastric pain: Secondary | ICD-10-CM | POA: Diagnosis not present

## 2019-06-12 DIAGNOSIS — R300 Dysuria: Secondary | ICD-10-CM

## 2019-06-12 LAB — POCT URINALYSIS DIP (MANUAL ENTRY)
Bilirubin, UA: NEGATIVE
Blood, UA: NEGATIVE
Glucose, UA: NEGATIVE mg/dL
Ketones, POC UA: NEGATIVE mg/dL
Leukocytes, UA: NEGATIVE
Nitrite, UA: NEGATIVE
Protein Ur, POC: NEGATIVE mg/dL
Spec Grav, UA: >=1.03 — AB (ref 1.010–1.025)
Urobilinogen, UA: 0.2 E.U./dL
pH, UA: 5.5 (ref 5.0–8.0)

## 2019-06-12 NOTE — Progress Notes (Signed)
   [facility] Phone: [phone]     KATTIE PINK - 53 y.o. female MRN [id number]  Date of birth: 1965-12-20  Subjective:   cc: abdominal pain  HPI:  Abdominal pain: epigastric, radiating tot he back, along the left side.  She has had a kidney stone in the pas, which hurt much worse.  Current pain has been going on a week, constant, feels like a hunger pain.  Can feel it before or after she eats, not really any association with food.  Took some penicillin that was left over from previous illness and took that twice in twelve hours. She thinks this helped temporarily. No diarrhea.  No n/v. No blood in stool. No regurgitation or heartburn.  She hasn't taken prilosec 'in a while'. No recent nsaid or alcohol use.    Dysuria: One episode of dysuria.  Last Thursday She was busy at work and hadn't urinated in a while  and she still felt like she had to urinate afterwards, and felt some burning then. Then she said she noticed her burning 3 days ago. No fever.  No blood in urine.   Balance issues: no changes in hearing.  Felt 'off balance' two days ago and today when walking.  Says it's 'almost like vertigo'.  She could see 'little waves' for 30 mintues two days ago.  She has a slight pain over her right ear.     ROS: See HPI for pertinent positives and negatives  Past Medical History  Family history reviewed for today's visit. No changes.  Social history- patient is a former smoker  Objective:   BP 130/68   Pulse (!) 54   Ht 5\' 6"  (1.676 m)   Wt 198 lb 2 oz (89.9 kg)   LMP 09/27/2014 (Approximate)   SpO2 98%   BMI 31.98 kg/m  Gen: NAD, alert and oriented, cooperative with exam CV: normal rate, regular rhythm. No murmurs, no rubs.  Resp: LCTAB, no wheezes, crackles. normal work of breathing GI: nontender to palpation, BS present, no guarding or organomegaly Neuro: CN II-XII grossly intact. no gross deficits Psych: Appropriate behavior  Assessment/Plan:   Dyspepsia Epigastric/LUQ pain radiating to the back. Not associated with food.  No reflux symptoms of regurgitation, heartburn.   - urea breath test - if positive treat h. Pylori.  If negative start H2 blocker.     Dysuria One episode of feeling full after void.  Some mild dysuria over past three days.  Urine dipstick negative in clinic.  - continue to monitor.  Unlikely uti.   Balance problem Two episodes of feeling 'off balance' when walking.  Mild pain near right ear but no hearing changes.  Saw 'little waves' two days ago for about thirty minutes.  Possibly BPPV.  Possibly migraines given one sided pain and visual disturbance.   - continue to monitor, work up if worsens/becomes more frequent.     Clemetine Marker, MD PGY-2 [facility]

## 2019-06-12 NOTE — Patient Instructions (Addendum)
I have ordered a test to check for a bacteria in your stomach called H. Pylori. If it is positive I will tell you and call in a prescription for you.  If it is negative we will treat you with an antacid medication.    Your urinalysis was negative.    If your dizziness worsens, please make an appointment with your pcp to discuss treatment options.    Have a great day,   Cassandra Marker, MD   Indigestion Indigestion is a feeling of pain, discomfort, burning, or fullness in the upper part of your abdomen. It can come and go. It may occur frequently or rarely. Indigestion tends to occur while you are eating or right after you have finished eating. It may be worse at night and while bending over or lying down. Indigestion may be a symptom of an underlying digestive condition. Follow these instructions at home: Eating and drinking   Follow an eating plan as recommended by your health care provider.  Avoid certain foods and drinks as told by your health care provider. This may include: ? Chocolate and cocoa. ? Peppermint and mint flavorings. ? Garlic and onions. ? Horseradish. ? Spicy and acidic foods, including peppers, chili powder, curry powder, vinegar, hot sauces, and barbecue sauce. ? Citrus fruits, such as oranges, lemons, and limes. ? Tomato-based foods, such as red sauce, chili, salsa, and pizza with red sauce. ? Fried and fatty foods, such as donuts, french fries, potato chips, and high-fat dressings. ? High-fat meats, such as hot dogs and fatty cuts of red and white meats, such as rib eye steak, sausage, ham, and bacon. ? High-fat dairy items, such as whole milk, butter, and cream cheese. ? Coffee and tea (with or without caffeine). ? Drinks that contain alcohol. ? Energy drinks and sports drinks. ? Carbonated drinks or sodas. ? Citrus fruit juices.  Eat small, frequent meals instead of large meals.  Avoid drinking large amounts of liquid with your meals.  Avoid eating meals  during the 2-3 hours before bedtime.  Avoid lying down right after you eat.  Avoid exercise for 2 hours after you eat. Lifestyle      Maintain a healthy weight. Ask your health care provider what weight is healthy for you. If you need to lose weight, work with your health care provider to do so safely.  Exercise for at least 30 minutes on 5 or more days each week, or as told by your health care provider. Avoid exercises that include bending forward. This can make your symptoms worse.  Wear loose-fitting clothing. Do not wear anything tight around your waist that causes pressure on your abdomen.  Do not use any products that contain nicotine or tobacco, such as cigarettes, e-cigarettes, and chewing tobacco. These can make symptoms worse. If you need help quitting, ask your health care provider.  Raise (elevate) the head of your bed about 6 inches (15 cm) when you sleep.  Try to reduce your stress, such as with yoga or meditation. If you need help reducing stress, ask your health care provider. General instructions  Take over-the-counter and prescription medicines only as told by your health care provider. Do not take aspirin, ibuprofen, or other NSAIDs unless your health care provider told you to do so.  Pay attention to any changes in your symptoms.  Keep all follow-up visits as told by your health care provider. This is important. Contact a health care provider if:  You have new symptoms.  You  have unexplained weight loss.  You have difficulty swallowing, or it hurts to swallow.  Your symptoms do not improve with treatment.  Your symptoms last for more than 2 days.  You have a fever.  You vomit. Get help right away if:  You have pain in your arms, neck, jaw, teeth, or back.  You feel sweaty, dizzy, or light-headed.  You faint.  You have chest pain or shortness of breath.  You cannot stop vomiting, or you vomit blood.  Your stool is bloody or black.  You have  severe pain in your abdomen. These symptoms may represent a serious problem that is an emergency. Do not wait to see if the symptoms will go away. Get medical help right away. Call your local emergency services (911 in the [place] Do not drive yourself to the hospital. Summary  Indigestion is a feeling of pain, discomfort, burning, or fullness in the upper part of your abdomen. It tends to occur while you are eating or right after you have finished eating.  Follow an eating plan and other lifestyle changes as told by your health care provider.  Take over-the-counter and prescription medicines only as told by your health care provider. Do not take aspirin, ibuprofen, or other NSAIDs unless your health care provider told you to do so.  Contact your health care provider if your symptoms do not get better or they get worse. Some symptoms may represent a serious problem that is an emergency. Do not wait to see if the symptoms will go away. Get medical help right away. This information is not intended to replace advice given to you by your health care provider. Make sure you discuss any questions you have with your health care provider. Document Released: 10/21/2004 Document Revised: 02/13/2018 Document Reviewed: 02/13/2018 Elsevier Patient Education  2020 [employer].

## 2019-06-14 LAB — H. PYLORI BREATH TEST: H pylori Breath Test: NEGATIVE

## 2019-06-15 DIAGNOSIS — R2689 Other abnormalities of gait and mobility: Secondary | ICD-10-CM | POA: Insufficient documentation

## 2019-06-15 DIAGNOSIS — R300 Dysuria: Secondary | ICD-10-CM | POA: Insufficient documentation

## 2019-06-15 NOTE — Assessment & Plan Note (Signed)
One episode of feeling full after void.  Some mild dysuria over past three days.  Urine dipstick negative in clinic.  - continue to monitor.  Unlikely uti.

## 2019-06-15 NOTE — Assessment & Plan Note (Signed)
Two episodes of feeling 'off balance' when walking.  Mild pain near right ear but no hearing changes.  Saw 'little waves' two days ago for about thirty minutes.  Possibly BPPV.  Possibly migraines given one sided pain and visual disturbance.   - continue to monitor, work up if worsens/becomes more frequent.

## 2019-06-15 NOTE — Assessment & Plan Note (Signed)
Epigastric/LUQ pain radiating to the back. Not associated with food.  No reflux symptoms of regurgitation, heartburn.   - urea breath test - if positive treat h. Pylori.  If negative start H2 blocker.

## 2019-06-18 ENCOUNTER — Telehealth: Payer: Self-pay | Admitting: Family Medicine

## 2019-06-18 NOTE — Telephone Encounter (Signed)
She is needing to get her lab results from last week, thanks.

## 2019-06-18 NOTE — Telephone Encounter (Signed)
Will forward to Dr. Jeannine Kitten who saw patient for this appointment.  Nazim Kadlec,CMA

## 2019-06-19 ENCOUNTER — Other Ambulatory Visit: Payer: Self-pay | Admitting: Family Medicine

## 2019-06-19 MED ORDER — FAMOTIDINE 20 MG PO TABS: 20.0000 mg | ORAL_TABLET | Freq: Two times a day (BID) | ORAL | 3 refills | Status: DC

## 2019-06-19 NOTE — Progress Notes (Signed)
Called pt and let her know of negative results for h. Pylori.  Started her on 20mg  pepcid BID.  Advised her to make appt with her pcp for further evaluation if this does not improve her symptoms.

## 2019-09-26 ENCOUNTER — Encounter: Payer: Self-pay | Admitting: Family Medicine

## 2019-09-26 ENCOUNTER — Other Ambulatory Visit: Payer: Self-pay

## 2019-09-26 ENCOUNTER — Ambulatory Visit (INDEPENDENT_AMBULATORY_CARE_PROVIDER_SITE_OTHER): Payer: 59 | Admitting: Family Medicine

## 2019-09-26 ENCOUNTER — Other Ambulatory Visit (HOSPITAL_COMMUNITY)
Admission: RE | Admit: 2019-09-26 | Discharge: 2019-09-26 | Disposition: A | Discharge status: Home / Self Care | Payer: 59 | Source: Ambulatory Visit | Attending: Family Medicine | Admitting: Family Medicine

## 2019-09-26 ENCOUNTER — Other Ambulatory Visit: Payer: Self-pay | Admitting: Family Medicine

## 2019-09-26 VITALS — BP 140/78 | HR 72 | Ht 66.0 in | Wt 194.0 lb

## 2019-09-26 DIAGNOSIS — Z131 Encounter for screening for diabetes mellitus: Secondary | ICD-10-CM | POA: Diagnosis not present

## 2019-09-26 DIAGNOSIS — Z13220 Encounter for screening for lipoid disorders: Secondary | ICD-10-CM | POA: Diagnosis not present

## 2019-09-26 DIAGNOSIS — H6121 Impacted cerumen, right ear: Secondary | ICD-10-CM

## 2019-09-26 DIAGNOSIS — Z124 Encounter for screening for malignant neoplasm of cervix: Secondary | ICD-10-CM

## 2019-09-26 DIAGNOSIS — Z0000 Encounter for general adult medical examination without abnormal findings: Secondary | ICD-10-CM

## 2019-09-26 DIAGNOSIS — Z130 Encounter for screening for diseases of the blood and blood-forming organs and certain disorders involving the immune mechanism: Secondary | ICD-10-CM

## 2019-09-26 DIAGNOSIS — E559 Vitamin D deficiency, unspecified: Secondary | ICD-10-CM

## 2019-09-26 DIAGNOSIS — I10 Essential (primary) hypertension: Secondary | ICD-10-CM

## 2019-09-26 DIAGNOSIS — E782 Mixed hyperlipidemia: Secondary | ICD-10-CM

## 2019-09-26 DIAGNOSIS — R7303 Prediabetes: Secondary | ICD-10-CM

## 2019-09-26 DIAGNOSIS — Z1239 Encounter for other screening for malignant neoplasm of breast: Secondary | ICD-10-CM

## 2019-09-26 LAB — POCT GLYCOSYLATED HEMOGLOBIN (HGB A1C): Hemoglobin A1C: 6.1 % — AB (ref 4.0–5.6)

## 2019-09-26 MED ORDER — AMLODIPINE BESYLATE 10 MG PO TABS: 10.0000 mg | ORAL_TABLET | Freq: Every day | ORAL | 3 refills | Status: DC

## 2019-09-26 NOTE — Progress Notes (Signed)
Subjective:    Patient ID: Cassandra Osborne, female    DOB: 06-09-1966, 53 y.o.   MRN: [id number]   CC: Pap smear, headache, ear complaint  HPI: Patient is here today for a Pap smear and blood work for her annual physical for insurance purposes.  She has had normal Pap smears in the past.  Headache: Patient notes that she has had headache frequently in the last few weeks, she took her blood pressure at home and it was 169/90.  She denies any dizziness, vision changes.  She is currently taking amlodipine 5 mg.  Ear complaint: Patient noticed that her ear is sometimes difficult to hear out of she has a feeling of fullness, she still can hear appropriately just notices it is more difficult to hear out of her right ear.  She would like to have her ears examined  Smoking status reviewed   ROS: pertinent noted in the HPI   Past medical history, surgical, family, and social history reviewed and updated in the EMR as appropriate.  Objective:  BP 140/78   Pulse 72   Ht 5\' 6"  (1.676 m)   Wt 194 lb (88 kg)   LMP 09/27/2014 (Approximate)   SpO2 98%   BMI 31.31 kg/m   Vitals and nursing note reviewed  General: NAD, pleasant, able to participate in exam Ears: L ear TM neutral position, clear; R ear blocked by cerumen, cannot visualize TM Cardiac: RRR, S1 S2 present. normal heart sounds, no murmurs. Respiratory: CTAB, normal effort, No wheezes, rales or rhonchi Extremities: no edema or cyanosis. Pelvic:  Normal appearing external female genitalia, normal vaginal epithelium, no abnormal discharge. Normal appearing cervix. Skin: warm and dry, no rashes noted Neuro: alert, no obvious focal deficits Psych: Normal affect and mood   Assessment & Plan:  Cassandra Osborne is a 53 year old woman here for a Pap smear, annual labs, diabetes screening, excessive earwax.  Hyperlipidemia Last lipid panel 11/2017 with total cholesterol 226, HDL 45, LDL 161, triglyceride 101.  Patient has never been on a  statin. -Repeat lipid panel  Prediabetes Previously well controlled.  Last A1c June 2019 was 6.0. -Repeat hemoglobin A1c today -Obtain BMP today -Recommended yearly diabetic eye exam with ophthalmologist -Urine microalbumin ordered today -Recommend foot check with PCP at next follow-up appointment  HTN (hypertension) Patient has been on amlodipine 5 mg.  She takes her blood pressure medication daily, and also checks her blood pressure on with her cuff at home.  She has noticed headaches, and when she is having headaches her blood pressure has been greater than 160/90.  Although today her blood pressure is improved and she is asymptomatic, 140/70, we discussed strict return precautions for going to our clinic, urgent care, or the emergency room if she is having symptoms such as headache, change in vision, new motor problem, or difficulty speaking.  We discussed the possibility of starting a second blood pressure agent such as an ACE inhibitor or an ARB, at this time patient wishes to add in more lifestyle modification such as the DASH diet and exercise and to increase amlodipine before adding in a second line agent.  Patient is highly motivated to achieve lifestyle modifications first. -Increase amlodipine to 10 mg daily -Follow-up in 1 month to check on blood pressure -Referral to nutrition for management of hypertension/DASH diet  Vitamin D deficiency Patient has history of vitamin D deficiency, has previously been on vitamin D 50,000 units once weekly. -Check vitamin D level today  Healthcare  maintenance -Pap smear with HPV reflex.  If negative results from today, patient would only need 2 more Pap smears at 5 and 10 years respectively. -Mammogram ordered, last in 2018.  Excessive ear wax Patient complaining of noticing fullness in her right ear.  On exam there is much earwax present, after attempting to disimpact with a curette, still could not visualize TM.  Left ear TM clear and  normal. -Recommend Debrox over-the-counter -Follow-up ear exam after removing wax at next visit   Gladys Damme, MD [place] PGY-1

## 2019-09-26 NOTE — Patient Instructions (Addendum)
It was a pleasure meeting you today!  1.  We increased amlodipine to 10 mg daily, I recommend you check your blood pressure every day, and if you have headaches, changes in vision, do not feel well and your blood pressure is over 160, I recommend that you make an appointment to see [place] or go to the emergency room.  2.  I have put in a referral to nutrition to help with the Dash diet and lifestyle modifications to decrease her blood pressure.  You should receive a call from them.  3.  You have had a referral for mammogram, you should receive a call to schedule this.  4.  We did blood work for diabetes, cholesterol, and vitamin D, we will call you with the results of these if any are abnormal.  5.  Recommend you use Debrox for your earwax over the counter  6. I recommend you follow up with your PCP in 1 month to follow up on your blood pressure.  Be Well!  Dr. Chauncey Reading

## 2019-09-26 NOTE — Progress Notes (Signed)
gy

## 2019-09-27 LAB — CBC
Hematocrit: 42.4 % (ref 34.0–46.6)
Hemoglobin: 13.6 g/dL (ref 11.1–15.9)
MCH: 25.3 pg — ABNORMAL LOW (ref 26.6–33.0)
MCHC: 32.1 g/dL (ref 31.5–35.7)
MCV: 79 fL (ref 79–97)
Platelets: 235 10*3/uL (ref 150–450)
RBC: 5.37 x10E6/uL — ABNORMAL HIGH (ref 3.77–5.28)
RDW: 13.8 % (ref 11.7–15.4)
WBC: 4.2 10*3/uL (ref 3.4–10.8)

## 2019-09-27 LAB — BASIC METABOLIC PANEL
BUN/Creatinine Ratio: 13 (ref 9–23)
BUN: 11 mg/dL (ref 6–24)
CO2: 22 mmol/L (ref 20–29)
Calcium: 9.9 mg/dL (ref 8.7–10.2)
Chloride: 107 mmol/L — ABNORMAL HIGH (ref 96–106)
Creatinine, Ser: 0.83 mg/dL (ref 0.57–1.00)
GFR calc Af Amer: 92 mL/min/{1.73_m2} (ref >59)
GFR calc non Af Amer: 80 mL/min/{1.73_m2} (ref >59)
Glucose: 109 mg/dL — ABNORMAL HIGH (ref 65–99)
Potassium: 4.5 mmol/L (ref 3.5–5.2)
Sodium: 144 mmol/L (ref 134–144)

## 2019-09-27 LAB — LIPID PANEL
Chol/HDL Ratio: 5.1 ratio — ABNORMAL HIGH (ref 0.0–4.4)
Cholesterol, Total: 255 mg/dL — ABNORMAL HIGH (ref 100–199)
HDL: 50 mg/dL (ref >39)
LDL Chol Calc (NIH): 191 mg/dL — ABNORMAL HIGH (ref 0–99)
Triglycerides: 82 mg/dL (ref 0–149)
VLDL Cholesterol Cal: 14 mg/dL (ref 5–40)

## 2019-09-27 LAB — VITAMIN D 25 HYDROXY (VIT D DEFICIENCY, FRACTURES): Vit D, 25-Hydroxy: 12.5 ng/mL — ABNORMAL LOW (ref 30.0–100.0)

## 2019-09-28 ENCOUNTER — Encounter: Payer: Self-pay | Admitting: Family Medicine

## 2019-09-28 DIAGNOSIS — E559 Vitamin D deficiency, unspecified: Secondary | ICD-10-CM | POA: Insufficient documentation

## 2019-09-28 DIAGNOSIS — H6120 Impacted cerumen, unspecified ear: Secondary | ICD-10-CM | POA: Insufficient documentation

## 2019-09-28 DIAGNOSIS — Z0000 Encounter for general adult medical examination without abnormal findings: Secondary | ICD-10-CM | POA: Insufficient documentation

## 2019-09-28 NOTE — Assessment & Plan Note (Signed)
Last lipid panel 11/2017 with total cholesterol 226, HDL 45, LDL 161, triglyceride 101.  Patient has never been on a statin. -Repeat lipid panel

## 2019-09-28 NOTE — Assessment & Plan Note (Signed)
Patient has been on amlodipine 5 mg.  She takes her blood pressure medication daily, and also checks her blood pressure on with her cuff at home.  She has noticed headaches, and when she is having headaches her blood pressure has been greater than 160/90.  Although today her blood pressure is improved and she is asymptomatic, 140/70, we discussed strict return precautions for going to our clinic, urgent care, or the emergency room if she is having symptoms such as headache, change in vision, new motor problem, or difficulty speaking.  We discussed the possibility of starting a second blood pressure agent such as an ACE inhibitor or an ARB, at this time patient wishes to add in more lifestyle modification such as the DASH diet and exercise and to increase amlodipine before adding in a second line agent.  Patient is highly motivated to achieve lifestyle modifications first. -Increase amlodipine to 10 mg daily -Follow-up in 1 month to check on blood pressure -Referral to nutrition for management of hypertension/DASH diet

## 2019-09-28 NOTE — Assessment & Plan Note (Signed)
-  Pap smear with HPV reflex.  If negative results from today, patient would only need 2 more Pap smears at 5 and 10 years respectively. -Mammogram ordered, last in 2018.

## 2019-09-28 NOTE — Assessment & Plan Note (Addendum)
Previously well controlled.  Last A1c June 2019 was 6.0. -Repeat hemoglobin A1c today -Obtain BMP today -Recommended yearly diabetic eye exam with ophthalmologist -Urine microalbumin ordered today -Recommend foot check with PCP at next follow-up appointment

## 2019-09-28 NOTE — Assessment & Plan Note (Signed)
Patient has history of vitamin D deficiency, has previously been on vitamin D 50,000 units once weekly. -Check vitamin D level today

## 2019-09-28 NOTE — Assessment & Plan Note (Signed)
Patient complaining of noticing fullness in her right ear.  On exam there is much earwax present, after attempting to disimpact with a curette, still could not visualize TM.  Left ear TM clear and normal. -Recommend Debrox over-the-counter -Follow-up ear exam after removing wax at next visit

## 2019-10-01 ENCOUNTER — Telehealth: Payer: Self-pay | Admitting: *Deleted

## 2019-10-01 LAB — SPECIMEN STATUS REPORT

## 2019-10-01 NOTE — Telephone Encounter (Signed)
Pt would like to know the results of her bloodwork. Christen Bame, CMA

## 2019-10-02 LAB — MICROALBUMIN / CREATININE URINE RATIO
Creatinine, Urine: 232 mg/dL
Microalb/Creat Ratio: 16 mg/g creat (ref 0–29)
Microalbumin, Urine: 38 ug/mL

## 2019-10-02 NOTE — Telephone Encounter (Signed)
Reviewed results of bloodwork: kidney function looks good, her vitamin D was low, and cholesterol was high (ASCVD risk 9.7%). Her hemoglobin A1c was 6.1, 6 months ago it was 6.0, so she still has pre-diabetes. She should talk to her PCP (Dr. Ky Barban) about starting a statin. If she calls back about her results, please tell her this and that I will refill her Vitamin D medication. Still waiting for pap cytology to result.   Gladys Damme, MD [place], PGY-1

## 2019-10-03 LAB — CYTOLOGY - PAP
Comment: NEGATIVE
Diagnosis: NEGATIVE
High risk HPV: NEGATIVE

## 2019-10-05 ENCOUNTER — Ambulatory Visit: Payer: 59 | Attending: Internal Medicine

## 2019-10-05 DIAGNOSIS — Z20822 Contact with and (suspected) exposure to covid-19: Secondary | ICD-10-CM

## 2019-10-06 LAB — NOVEL CORONAVIRUS, NAA: SARS-CoV-2, NAA: NOT DETECTED

## 2019-10-22 ENCOUNTER — Encounter: Payer: Self-pay | Admitting: Dietician

## 2019-10-22 ENCOUNTER — Encounter: Payer: 59 | Attending: Family Medicine | Admitting: Dietician

## 2019-10-22 ENCOUNTER — Other Ambulatory Visit: Payer: Self-pay

## 2019-10-22 DIAGNOSIS — I10 Essential (primary) hypertension: Secondary | ICD-10-CM | POA: Insufficient documentation

## 2019-10-22 NOTE — Progress Notes (Signed)
Medical Nutrition Therapy  Appt Start Time: 2:00pm   End Time: 2:50pm  Primary concerns today: hypertension and hypercholesterolemia   Referral diagnosis: I10- essential hypertension  Preferred learning style: no preference indicated Learning readiness: change in progress   NUTRITION ASSESSMENT   Clinical Medical Hx: HTN, hyperlipidemia, prediabetes, vitamin D deficiency  Lifestyle & Dietary Hx Patient states she has recently changed her diet. Tried a 10-day green smoothie diet and lost significant weight with this. Patient states she now only drinks 1 smoothie per day, rather than at all 3 meals as before.   States that dairy may cause gas/bloating. Avoids fried foods, beef, and added sugars. States she previously ate lots of ice cream and desserts consistently which she has greatly reduced. States she eats more veggies, especially greens such as spinach and broccoli. For snack may have homemade trail mix. May sometimes have oatmeal for breakfast. Typical meal pattern is 2-3 meals per day and maybe a snack. States she often gets hungry throughout the day.   Supplements: one-a-day MVI, vitamin D  Current average weekly physical activity: walking, sit ups   24-Hr Dietary Recall First Meal: frozen fruits + spinach + flax seeds + Metamucil  Snack: - Second Meal: salad with chicken or [place]  Snack: homemade trail mix (nuts + dried fruit + M&M's)  Third Meal: pork chop + broccoli + green beans  Snack: - Beverages: water, sometimes juice   Estimated Energy Needs Calories: 1600-1800 Carbohydrate: 200-225g Protein: 100-113g Fat: 50-55g    NUTRITION DIAGNOSIS  Inadequate protein intake (NI-5.6.1) related to recent diet as evidenced by reported food history which reflects diet providing insufficient amount of protein to meet estimated needs and reports of feeling hungry throughout the day despite eating meals/snacks.    NUTRITION INTERVENTION  Nutrition education (E-1) on the  following topics:  . Hypertension nutrition therapy/ DASH diet/ heart healthy eating . Importance of protein   Handouts Provided Include   Hypertension Nutrition Therapy   Learning Style & Readiness for Change Teaching method utilized: Visual & Auditory  Demonstrated degree of understanding via: Teach Back  Barriers to learning/adherence to lifestyle change: None Identified   Goals Established by Pt . Limit sodium intake, for example by checking nutrition labels and remaining under 300mg  per serving and not adding salt to foods.  . Work on including a source of protein with breakfast, such as adding protein powder or peanut butter to smoothie or oatmeal.   MONITORING & EVALUATION Dietary intake, weekly physical activity, and goals prn.  Next Steps  Patient is to contact [facility] to schedule follow up appointment as needed/desired.

## 2019-10-22 NOTE — Patient Instructions (Signed)
   Remember to keep an eye on nutrition facts labels and check on the sodium (try to stay under 300mg  per serving.)   Try to not add salt to food; maybe use other herbs, spices, and seasonings for flavor.   Work on including some protein with your breakfast.

## 2019-10-23 ENCOUNTER — Encounter: Payer: Self-pay | Admitting: Dietician

## 2019-11-01 NOTE — Progress Notes (Signed)
   CHIEF COMPLAINT / HPI:  Anxiety attacks Cassandra Osborne has been experiencing severe symptoms of anxiety and some symptoms of depression for the past 3 weeks.  3 weeks ago she learned about her husband had been seeing another woman.  She confronted him about his affair and they have been working through this together at home.  She feels safe at home and has no concerns about her husband ever harming her.  This has been an incredibly uncomfortable and distressing situation for the past 3 weeks.  She has been incredibly emotional and tearful and anxious.   She has had 2 episodes that she believes are panic attacks.  During these episodes, she becomes increasingly anxious and starts breathing quickly and feels that she is unable to control her breathing or her heart rate.  During these episodes, her daughter has been with her and has been able to calmly talk her through the situation and remind her that she is in a safe place.  She would like to know if there is any medication that can be given to help reduce these distressing symptoms of anxiety.  PERTINENT  PMH / PSH: Noncontributory  OBJECTIVE: BP 132/90   Pulse 65   Wt 188 lb (85.3 kg)   LMP 09/27/2014 (Approximate)   SpO2 95%   BMI 30.34 kg/m    General: Well-appearing 54 year old woman.  Clear emotional distress expressed throughout the visit.  Tearful at times. Cardio: Normal S1 and S2, no S3 or S4. Rhythm is regular. No murmurs or rubs.   Pulm: Clear to auscultation bilaterally, no crackles, wheezing, or diminished breath sounds. Normal respiratory effort  ASSESSMENT / PLAN:  Anxiety attack GAD-7 score of 18, PHQ-9 score of 16 no SI/HI.  Learning of her husband's infidelity has been incredibly difficult for Cassandra Osborne.  At present, she and her husband are going to try to work it out between the two of them.  Her children are aware of what is going on but Cassandra Osborne is trying not to involve them overmuch.  We will start medication  today for anxiety attacks with medical follow-up in 1 month.  Cassandra Osborne would like to pursue therapy with Dr. Hartford Poli in the family medicine clinic as long as that is covered by her insurance and understands that it will be for no more than 6 visits. -Start fluoxetine 20 mg daily -Ativan 0.5 mg as needed for anxiety attacks.  5 pills prescribed -Discussed with Dr. Hartford Poli who plans to follow-up with Cassandra Osborne.     Matilde Haymaker, MD [place]

## 2019-11-02 ENCOUNTER — Ambulatory Visit (INDEPENDENT_AMBULATORY_CARE_PROVIDER_SITE_OTHER): Payer: 59 | Admitting: Family Medicine

## 2019-11-02 ENCOUNTER — Telehealth: Payer: Self-pay | Admitting: Psychology

## 2019-11-02 ENCOUNTER — Other Ambulatory Visit: Payer: Self-pay

## 2019-11-02 DIAGNOSIS — F410 Panic disorder [episodic paroxysmal anxiety] without agoraphobia: Secondary | ICD-10-CM | POA: Diagnosis not present

## 2019-11-02 MED ORDER — LORAZEPAM 0.5 MG PO TABS: 0.5000 mg | ORAL_TABLET | Freq: Two times a day (BID) | ORAL | 1 refills | Status: DC | PRN

## 2019-11-02 MED ORDER — FLUOXETINE HCL 20 MG PO TABS: 20.0000 mg | ORAL_TABLET | Freq: Every day | ORAL | 3 refills | Status: DC

## 2019-11-02 NOTE — Assessment & Plan Note (Signed)
GAD-7 score of 18, PHQ-9 score of 16 no SI/HI.  Learning of her husband's infidelity has been incredibly difficult for Cassandra Osborne.  At present, she and her husband are going to try to work it out between the two of them.  Her children are aware of what is going on but Cassandra Osborne is trying not to involve them overmuch.  We will start medication today for anxiety attacks with medical follow-up in 1 month.  Cassandra Osborne would like to pursue therapy with Dr. Hartford Poli in the family medicine clinic as long as that is covered by her insurance and understands that it will be for no more than 6 visits. -Start fluoxetine 20 mg daily -Ativan 0.5 mg as needed for anxiety attacks.  5 pills prescribed -Discussed with Dr. Hartford Poli who plans to follow-up with Cassandra Osborne.

## 2019-11-02 NOTE — Telephone Encounter (Signed)
Schedule in person [facility] appt with Dr. Hartford Poli for 2/17 at 2pm

## 2019-11-02 NOTE — Patient Instructions (Signed)
I am sorry that these past 3 weeks have been terrible for you.  I am glad you came in so that we can begin to help you.  Anxiety attacks: Please start taking for prozac 20 mg daily.  This will take 4-6 weeks to take full effect.  Do not be discouraged if you don't feel better immediately.  I have also prescribed several pills of Ativan for panic attacks.  I expect this to decrease in severity as tome goes on.  Please follow up in 4 weeks.  Therapy: Someone from the clinic will reach out to you in the next day or two to schedule therapy with Dr. Hartford Poli.  Call the clinic if you have not heard from [place] by the middle of next week.

## 2019-11-14 ENCOUNTER — Other Ambulatory Visit: Payer: Self-pay

## 2019-11-14 ENCOUNTER — Ambulatory Visit (INDEPENDENT_AMBULATORY_CARE_PROVIDER_SITE_OTHER): Payer: 59 | Admitting: Psychology

## 2019-11-14 DIAGNOSIS — F410 Panic disorder [episodic paroxysmal anxiety] without agoraphobia: Secondary | ICD-10-CM

## 2019-11-14 NOTE — BH Specialist Note (Signed)
Integrated Behavioral Health  11/14/2019 LEVON ASARO [id number]   Session Start time: 2pm  Session End time: 230pm Total time: 1  Referring Provider: Dr. Pilar Plate   PRESENTING CONCERNS: Patient and/or family reports the following symptoms/concerns: Pt reports she experiences anxiety related to recently finding out her husband has cheated on her. Pt reports she finds herself in states of obsessively thinking about the affair .  Pt reported she feels anger and stress.  Pt reported she thinks about hurting her husband and the women he had an affair with but denies intent.  Pt reports she feels she has a good control over her anger. Pt and Dr. Hartford Poli discussed coping strategies to find space to fight her anxiety and give herself time to think about her experiences.  Pt denied SI.   Duration of problem: past months; Severity of problem: moderate  STRENGTHS (Protective Factors/Coping Skills): Family support, insightful   GOALS ADDRESSED: Patient will: 1.  Reduce symptoms of: anxiety: GAD 14 (decreased from 18 last visit)  2.  Increase knowledge and/or ability of: stress reduction : finding space and time to reflect and challenge anxious thoughts with CBT 3.  Demonstrate ability to: Increase healthy adjustment to current life circumstances  INTERVENTIONS: Interventions utilized:  Brief CBT Standardized Assessments completed: GAD-7  ASSESSMENT: Patient currently experiencing anxiety related to recent affair of husband.   Patient may benefit from CBT in challenging her anxiety and supportive counseling to process relationship.   PLAN: 1. Follow up with behavioral health clinician on : Self-care and challenging anxious thoughts  2. Behavioral recommendations: CBT  3. Referral(s): [facility] (In Clinic)  Erlinda Hong, PhD., LMFT-A

## 2019-11-26 ENCOUNTER — Ambulatory Visit: Payer: 59 | Admitting: Psychology

## 2019-12-25 ENCOUNTER — Telehealth (INDEPENDENT_AMBULATORY_CARE_PROVIDER_SITE_OTHER): Payer: 59 | Admitting: Family Medicine

## 2019-12-25 ENCOUNTER — Other Ambulatory Visit: Payer: Self-pay

## 2019-12-25 DIAGNOSIS — N898 Other specified noninflammatory disorders of vagina: Secondary | ICD-10-CM | POA: Diagnosis not present

## 2019-12-25 MED ORDER — FLUCONAZOLE 150 MG PO TABS: ORAL_TABLET | ORAL | 0 refills | Status: DC

## 2019-12-25 NOTE — Assessment & Plan Note (Signed)
Symptoms not entirely consistent with yeast infection, however patient is insistent that she has a yeast infection given her primary symptoms are thicker white discharge and vaginal itching.  She did endorse some burning with urination however she said that it was very mild.  Denies any other UTI symptoms.  Given her inability to come in for an in person evaluation at this time, will go ahead and empirically treat her with Diflucan.  We discussed that if no improvement in symptoms she should come in for in person evaluation in order to obtain a wet prep and urinalysis.  She understood and agreed to plan.

## 2019-12-25 NOTE — Progress Notes (Signed)
[facility] Telemedicine Visit  I connected with Barbra Sarks on 12/25/19 at  8:30 AM EDT by a video enabled telemedicine application and verified that I am speaking with the correct person using two identifiers.     I discussed the limitations of evaluation and management by telemedicine and the availability of in person appointments.  I discussed that the purpose of this telehealth visit is to provide medical care while limiting exposure to the novel coronavirus.  The patient expressed understanding and agreed to proceed.  Patient consented to have virtual visit. Method of visit: Video  Encounter participants: Patient: Cassandra Osborne - located at home Provider: Danna Hefty - located at [facility] Others (if applicable): None  Chief Complaint: Yeast Infection   HPI: Patient notes she thinks she has a yeast infection. She has white thick discharge, small amount of burning with urination and primiarly itchy in the genital area. The symptoms started on 2-3 days ago. Sexually active with 1 female partner, her husband. She notes she tried a 1-day treatment with monistat without improvement. Denies any urgency, frequency, or hesitancy.   ROS: per HPI  Pertinent PMHx:   Exam:  Respiratory: Breathing comfortably on room air, speaking in full sentences  Assessment/Plan:  Vaginal itching Symptoms not entirely consistent with yeast infection, however patient is insistent that she has a yeast infection given her primary symptoms are thicker white discharge and vaginal itching.  She did endorse some burning with urination however she said that it was very mild.  Denies any other UTI symptoms.  Given her inability to come in for an in person evaluation at this time, will go ahead and empirically treat her with Diflucan.  We discussed that if no improvement in symptoms she should come in for in person evaluation in order to obtain a wet prep and urinalysis.  She understood  and agreed to plan.      I discussed the assessment and treatment plan with the patient. They were provided an opportunity to ask questions and all were answered. They agreed with the plan and demonstrated an understanding of the instructions.   They were advised to call back or seek an in-person evaluation in the emergency room if the symptoms worsen or if the condition fails to improve as anticipated.  Total time: 15 minutes. This includes time spent with the patient during the visit as well as time spent before and after the visit reviewing the chart, documenting the encounter, making phone calls, reviewing studies, etc.  Danna Hefty, [place], PGY2 12/25/2019 8:53 AM

## 2020-01-30 ENCOUNTER — Other Ambulatory Visit: Payer: Self-pay

## 2020-01-30 ENCOUNTER — Ambulatory Visit (INDEPENDENT_AMBULATORY_CARE_PROVIDER_SITE_OTHER): Payer: 59 | Admitting: Family Medicine

## 2020-01-30 ENCOUNTER — Encounter: Payer: Self-pay | Admitting: Family Medicine

## 2020-01-30 VITALS — BP 140/88 | HR 43 | Wt 190.0 lb

## 2020-01-30 DIAGNOSIS — I10 Essential (primary) hypertension: Secondary | ICD-10-CM

## 2020-01-30 DIAGNOSIS — E782 Mixed hyperlipidemia: Secondary | ICD-10-CM

## 2020-01-30 DIAGNOSIS — K219 Gastro-esophageal reflux disease without esophagitis: Secondary | ICD-10-CM

## 2020-01-30 DIAGNOSIS — R5383 Other fatigue: Secondary | ICD-10-CM | POA: Diagnosis not present

## 2020-01-30 DIAGNOSIS — M542 Cervicalgia: Secondary | ICD-10-CM

## 2020-01-30 MED ORDER — NAPROXEN 500 MG PO TABS: 500.0000 mg | ORAL_TABLET | Freq: Two times a day (BID) | ORAL | 0 refills | Status: DC

## 2020-01-30 MED ORDER — VITAMIN D (ERGOCALCIFEROL) 1.25 MG (50000 UNIT) PO CAPS: 50000.0000 [IU] | ORAL_CAPSULE | ORAL | 0 refills | Status: DC

## 2020-01-30 MED ORDER — DICLOFENAC SODIUM 1 % EX GEL: 4.0000 g | Freq: Four times a day (QID) | CUTANEOUS | 1 refills | Status: DC

## 2020-01-30 MED ORDER — CLOTRIMAZOLE-BETAMETHASONE 1-0.05 % EX CREA: 1.0000 "application " | TOPICAL_CREAM | Freq: Two times a day (BID) | CUTANEOUS | 0 refills | Status: DC

## 2020-01-30 MED ORDER — METHOCARBAMOL 500 MG PO TABS: 500.0000 mg | ORAL_TABLET | Freq: Three times a day (TID) | ORAL | 0 refills | Status: DC | PRN

## 2020-01-30 NOTE — Patient Instructions (Signed)
Thank you for coming to see me today. It was a pleasure! Today we talked about:   For your neck pain, I recommend that you continue trying heat pad as well as massage.  You may also start doing neck stretches and exercises.  There is some examples below.  I have given you Robaxin to try nightly prior to bed.  Please not operate a car after taking this as it causes some people to be drowsy.  We will also trial naproxen twice daily with food for 2 weeks.  If after 2 to 4 weeks you are still experiencing your neck and arm pain and please do not hesitate to return to the office.  I will release the results on MyChart and if anything is abnormal to give you a call.  Please follow-up as needed.  If you have any questions or concerns, please do not hesitate to call the office at [phone].  Take Care,   [place] Marybell, DO  Neck Exercises Ask your health care provider which exercises are safe for you. Do exercises exactly as told by your health care provider and adjust them as directed. It is normal to feel mild stretching, pulling, tightness, or discomfort as you do these exercises. Stop right away if you feel sudden pain or your pain gets worse. Do not begin these exercises until told by your health care provider. Neck exercises can be important for many reasons. They can improve strength and maintain flexibility in your neck, which will help your upper back and prevent neck pain. Stretching exercises Rotation neck stretching  1. Sit in a chair or stand up. 2. Place your feet flat on the floor, shoulder width apart. 3. Slowly turn your head (rotate) to the right until a slight stretch is felt. Turn it all the way to the right so you can look over your right shoulder. Do not tilt or tip your head. 4. Hold this position for 10-30 seconds. 5. Slowly turn your head (rotate) to the left until a slight stretch is felt. Turn it all the way to the left so you can look over your left shoulder. Do not  tilt or tip your head. 6. Hold this position for 10-30 seconds. Repeat __________ times. Complete this exercise __________ times a day. Neck retraction 1. Sit in a sturdy chair or stand up. 2. Look straight ahead. Do not bend your neck. 3. Use your fingers to push your chin backward (retraction). Do not bend your neck for this movement. Continue to face straight ahead. If you are doing the exercise properly, you will feel a slight sensation in your throat and a stretch at the back of your neck. 4. Hold the stretch for 1-2 seconds. Repeat __________ times. Complete this exercise __________ times a day. Strengthening exercises Neck press 1. Lie on your back on a firm bed or on the floor with a pillow under your head. 2. Use your neck muscles to push your head down on the pillow and straighten your spine. 3. Hold the position as well as you can. Keep your head facing up (in a neutral position) and your chin tucked. 4. Slowly count to 5 while holding this position. Repeat __________ times. Complete this exercise __________ times a day. Isometrics These are exercises in which you strengthen the muscles in your neck while keeping your neck still (isometrics). 1. Sit in a supportive chair and place your hand on your forehead. 2. Keep your head and face facing straight ahead. Do  not flex or extend your neck while doing isometrics. 3. Push forward with your head and neck while pushing back with your hand. Hold for 10 seconds. 4. Do the sequence again, this time putting your hand against the back of your head. Use your head and neck to push backward against the hand pressure. 5. Finally, do the same exercise on either side of your head, pushing sideways against the pressure of your hand. Repeat __________ times. Complete this exercise __________ times a day. Prone head lifts 1. Lie face-down (prone position), resting on your elbows so that your chest and upper back are raised. 2. Start with your head  facing downward, near your chest. Position your chin either on or near your chest. 3. Slowly lift your head upward. Lift until you are looking straight ahead. Then continue lifting your head as far back as you can comfortably stretch. 4. Hold your head up for 5 seconds. Then slowly lower it to your starting position. Repeat __________ times. Complete this exercise __________ times a day. Supine head lifts 1. Lie on your back (supine position), bending your knees to point to the ceiling and keeping your feet flat on the floor. 2. Lift your head slowly off the floor, raising your chin toward your chest. 3. Hold for 5 seconds. Repeat __________ times. Complete this exercise __________ times a day. Scapular retraction 1. Stand with your arms at your sides. Look straight ahead. 2. Slowly pull both shoulders (scapulae) backward and downward (retraction) until you feel a stretch between your shoulder blades in your upper back. 3. Hold for 10-30 seconds. 4. Relax and repeat. Repeat __________ times. Complete this exercise __________ times a day. Contact a health care provider if:  Your neck pain or discomfort gets much worse when you do an exercise.  Your neck pain or discomfort does not improve within 2 hours after you exercise. If you have any of these problems, stop exercising right away. Do not do the exercises again unless your health care provider says that you can. Get help right away if:  You develop sudden, severe neck pain. If this happens, stop exercising right away. Do not do the exercises again unless your health care provider says that you can. This information is not intended to replace advice given to you by your health care provider. Make sure you discuss any questions you have with your health care provider. Document Revised: 07/12/2018 Document Reviewed: 07/12/2018 Elsevier Patient Education  [place].

## 2020-01-30 NOTE — Progress Notes (Signed)
   SUBJECTIVE:   CHIEF COMPLAINT / HPI:   Right sided neck pain  R shoulder pain: Used heat, ice, antiinflammatory medication. States she feels some pain in her ear. It is a nagging pain. She reports it does radiate down both of her shoulders, R>L. Patient reports that pain has been hurting bad for close to 2 weeks now. She has had no new trauma and did not change any habits. Has some numbness where she feels like she has to shake her hand that has been constant. No tingling. Has had shoulder pain before but this is a nagging pain in her arm. She also feels something in her right thigh. She had this before when her BP was high. May have had some weakness on right side. Feeling more tired. She is not sleeping well however because she can't get in a good position. Reports some HA in the back of her head. No new vision changes.  She has been taking BC powders which she knows is not good for her but they have been helping her pain.   Nausea: Happened yesterday and also had diarrhea, ate [employer] and a pizza. Has been having some hunger pains in the center of her chest and belly. Feels like indigestion.  She reports she has had this before.  She denies any blood in her stool, any episodes of vomiting.  Has prozac and ativan but not taking.   PERTINENT  PMH / PSH: HTN, dyspepsia, prediabetes, HLD  OBJECTIVE:  BP 140/88   Pulse (!) 43   Wt 190 lb (86.2 kg)   LMP 09/27/2014 (Approximate)   SpO2 98%   BMI 30.67 kg/m   General: NAD, pleasant Neck: Supple, tenderness along right side as well as bilateral trapezius muscles. Respiratory: normal work of breathing Gastrointestinal: soft, nontender, nondistended, normoactive BS Psych: AOx3, appropriate affect  ASSESSMENT/PLAN:   Neck pain Patient with knots on exam along trapezius and back of neck.  Pain appears to be consistent with MSK and no red flags noted.  Counseled patient on need to continue trying heat pad as well as massage.  Given  Voltaren gel to use topically for the pain.  Given handout on neck stretches and exercise.  Given Robaxin to try nightly prior to bed, and to avoid driving after taking this medication.  We will also trial naproxen twice daily with food for 2 weeks.  Patient counseled on discontinuing BC powders altogether.  Patient to return in 2 to 4 weeks if she is still experiencing your neck and arm pain   GERD (gastroesophageal reflux disease) Patient with nausea and diarrhea after eating [employer] and a pizza.  Patient reporting some continued burning in her chest and belly that feels like indigestion.  She reports she has had this before and this is not much different.  Counseled patient on need to discontinue BC powders.  Will monitor closely while on naproxen.  Did encourage her to take this with food.  She is to return if her symptoms not improved and counseled her on need to avoid dietary triggers such as [employer].  Patient encouraged to follow-up with her PCP regarding her anxiety and depression given that she has recently self stopped Prozac and has not been on Ativan for some time.  Martinique Lei, DO PGY-3, [facility]

## 2020-01-31 LAB — LIPID PANEL
Chol/HDL Ratio: 5.1 ratio — ABNORMAL HIGH (ref 0.0–4.4)
Cholesterol, Total: 240 mg/dL — ABNORMAL HIGH (ref 100–199)
HDL: 47 mg/dL (ref >39)
LDL Chol Calc (NIH): 178 mg/dL — ABNORMAL HIGH (ref 0–99)
Triglycerides: 86 mg/dL (ref 0–149)
VLDL Cholesterol Cal: 15 mg/dL (ref 5–40)

## 2020-01-31 LAB — CBC
Hematocrit: 41.9 % (ref 34.0–46.6)
Hemoglobin: 13 g/dL (ref 11.1–15.9)
MCH: 24.7 pg — ABNORMAL LOW (ref 26.6–33.0)
MCHC: 31 g/dL — ABNORMAL LOW (ref 31.5–35.7)
MCV: 80 fL (ref 79–97)
Platelets: 223 10*3/uL (ref 150–450)
RBC: 5.26 x10E6/uL (ref 3.77–5.28)
RDW: 13.8 % (ref 11.7–15.4)
WBC: 4.3 10*3/uL (ref 3.4–10.8)

## 2020-01-31 LAB — BASIC METABOLIC PANEL
BUN/Creatinine Ratio: 20 (ref 9–23)
BUN: 14 mg/dL (ref 6–24)
CO2: 22 mmol/L (ref 20–29)
Calcium: 9.8 mg/dL (ref 8.7–10.2)
Chloride: 106 mmol/L (ref 96–106)
Creatinine, Ser: 0.71 mg/dL (ref 0.57–1.00)
GFR calc Af Amer: 110 mL/min/{1.73_m2} (ref >59)
GFR calc non Af Amer: 96 mL/min/{1.73_m2} (ref >59)
Glucose: 72 mg/dL (ref 65–99)
Potassium: 4.1 mmol/L (ref 3.5–5.2)
Sodium: 142 mmol/L (ref 134–144)

## 2020-01-31 LAB — TSH: TSH: 1.3 u[IU]/mL (ref 0.450–4.500)

## 2020-02-01 ENCOUNTER — Other Ambulatory Visit: Payer: Self-pay | Admitting: Family Medicine

## 2020-02-01 MED ORDER — SIMVASTATIN 20 MG PO TABS: 20.0000 mg | ORAL_TABLET | Freq: Every day | ORAL | 3 refills | Status: DC

## 2020-02-04 DIAGNOSIS — K219 Gastro-esophageal reflux disease without esophagitis: Secondary | ICD-10-CM | POA: Insufficient documentation

## 2020-02-04 DIAGNOSIS — M542 Cervicalgia: Secondary | ICD-10-CM | POA: Insufficient documentation

## 2020-02-04 NOTE — Assessment & Plan Note (Addendum)
Patient with knots on exam along trapezius and back of neck.  Pain appears to be consistent with MSK and no red flags noted.  Counseled patient on need to continue trying heat pad as well as massage.  Given Voltaren gel to use topically for the pain.  Given handout on neck stretches and exercise.  Given Robaxin to try nightly prior to bed, and to avoid driving after taking this medication.  We will also trial naproxen twice daily with food for 2 weeks.  Patient counseled on discontinuing BC powders altogether.  Patient to return in 2 to 4 weeks if she is still experiencing your neck and arm pain

## 2020-02-04 NOTE — Assessment & Plan Note (Signed)
Patient with nausea and diarrhea after eating [employer] and a pizza.  Patient reporting some continued burning in her chest and belly that feels like indigestion.  She reports she has had this before and this is not much different.  Counseled patient on need to discontinue BC powders.  Will monitor closely while on naproxen.  Did encourage her to take this with food.  She is to return if her symptoms not improved and counseled her on need to avoid dietary triggers such as [employer].

## 2020-02-27 ENCOUNTER — Other Ambulatory Visit: Payer: Self-pay | Admitting: Family Medicine

## 2020-04-21 ENCOUNTER — Other Ambulatory Visit: Payer: Self-pay

## 2020-04-21 MED ORDER — AMLODIPINE BESYLATE 5 MG PO TABS: 5.0000 mg | ORAL_TABLET | Freq: Every day | ORAL | 1 refills | Status: DC

## 2020-05-05 ENCOUNTER — Other Ambulatory Visit: Payer: Self-pay | Admitting: Family Medicine

## 2020-05-05 DIAGNOSIS — E559 Vitamin D deficiency, unspecified: Secondary | ICD-10-CM

## 2020-07-15 ENCOUNTER — Other Ambulatory Visit: Payer: Self-pay

## 2020-07-15 DIAGNOSIS — I10 Essential (primary) hypertension: Secondary | ICD-10-CM

## 2020-07-16 NOTE — Telephone Encounter (Signed)
Patient returns call to nurse line regarding BP medication. Informed patient that refill was denied due to needing appointment. Patient scheduled with new PCP on 11/02. Can patient receive partial refill to last until scheduled appointment.   To PCP  Talbot Grumbling, RN

## 2020-07-17 MED ORDER — AMLODIPINE BESYLATE 5 MG PO TABS: 5.0000 mg | ORAL_TABLET | Freq: Every day | ORAL | 0 refills | Status: DC

## 2020-07-17 NOTE — Addendum Note (Signed)
Addended by: Francene Castle on: 07/17/2020 07:41 AM   Modules accepted: Orders

## 2020-07-26 ENCOUNTER — Other Ambulatory Visit: Payer: Self-pay | Admitting: Family Medicine

## 2020-07-26 DIAGNOSIS — I10 Essential (primary) hypertension: Secondary | ICD-10-CM

## 2020-07-29 ENCOUNTER — Ambulatory Visit (INDEPENDENT_AMBULATORY_CARE_PROVIDER_SITE_OTHER): Payer: 59 | Admitting: Family Medicine

## 2020-07-29 ENCOUNTER — Encounter: Payer: Self-pay | Admitting: Family Medicine

## 2020-07-29 ENCOUNTER — Other Ambulatory Visit: Payer: Self-pay

## 2020-07-29 VITALS — BP 140/72 | HR 86 | Ht 66.0 in | Wt 194.0 lb

## 2020-07-29 DIAGNOSIS — I10 Essential (primary) hypertension: Secondary | ICD-10-CM | POA: Diagnosis not present

## 2020-07-29 DIAGNOSIS — M545 Low back pain, unspecified: Secondary | ICD-10-CM | POA: Diagnosis not present

## 2020-07-29 MED ORDER — AMLODIPINE BESYLATE 5 MG PO TABS: 5.0000 mg | ORAL_TABLET | Freq: Every day | ORAL | 3 refills | Status: DC

## 2020-07-29 MED ORDER — TIZANIDINE HCL 4 MG PO TABS
4.0000 mg | ORAL_TABLET | Freq: Four times a day (QID) | ORAL | 0 refills | Status: DC | PRN
Start: 2020-07-29 — End: 2020-11-04

## 2020-07-29 NOTE — Assessment & Plan Note (Signed)
Blood pressure elevated to 140/72.  Home readings have ranged between 130-150/80-90.  Per patient request, continue Norvasc 5 mg with blood pressure log for 1 month and then reevaluate. -Follow-up 1 month BP log -Consider increasing amlodipine to 10 mg

## 2020-07-29 NOTE — Assessment & Plan Note (Signed)
Acute injury on 10/30 to lower back.  Patient has tried hydrocodone and naproxen without improvement. -Trial tizanidine -Reevaluate back pain at 1 month BP checkup visit

## 2020-07-29 NOTE — Progress Notes (Signed)
    SUBJECTIVE:   CHIEF COMPLAINT / HPI:   HTN: Currently taking Norvasc 5mg . Home BP readings runs in the 130-150s/80-90s. Has had occipital headaches for the last week. Denies vision changes or other symptoms.   Back Pain: Patient reported that it is returning heavy and ended up having shooting pain that was in her lower back.  The pain is more on the right medial side.  Patient reports that originally she was having difficulty with walking that has mildly improved in the last 3 days.  She took naproxen without improvement, she had an old hydrocodone tablets to ease some of the pain but made her nauseous.  In the past she has had episodes like this and has previously taken hydrocodone, Vicodin, Percocet, naproxen, Flexeril, Aleve with minimal improvement.  She denies saddle anesthesia, incontinence, fever, chills, weight loss.  She has a history of low back surgery and her back "occasionally goes out", she also has a back brace that she uses whenever she has these episodes.   PERTINENT  PMH / PSH: Reviewed  OBJECTIVE:   BP 140/72   Pulse 86   Ht 5\' 6"  (1.676 m)   Wt 194 lb (88 kg)   LMP 09/27/2014 (Approximate)   BMI 31.31 kg/m   Gen: well-appearing, NAD CV: RRR, no m/r/g appreciated, no peripheral edema Pulm: CTAB, no wheezes/crackles GI: soft, non-tender, non-distended MSK: slow, shuffled gait due to pain, pain with forward bending, non-tender to palpation in back  ASSESSMENT/PLAN:   HTN (hypertension) Blood pressure elevated to 140/72.  Home readings have ranged between 130-150/80-90.  Per patient request, continue Norvasc 5 mg with blood pressure log for 1 month and then reevaluate. -Follow-up 1 month BP log -Consider increasing amlodipine to 10 mg  Acute right-sided low back pain without sciatica Acute injury on 10/30 to lower back.  Patient has tried hydrocodone and naproxen without improvement. -Trial tizanidine -Reevaluate back pain at 1 month BP checkup visit      Cassandra Osborne, [place]

## 2020-07-29 NOTE — Patient Instructions (Signed)
It was a great seeing you today!  Today we discussed the following:  Hypertension: At the moment we will continue the Norvasc 5 mg daily.  I would like you to keep a blood pressure log with blood pressures 3 times a day for the next month.  Bring this log with you to your next appointment as we will evaluate if we need to do medication changes.  Back pain: We are prescribing tizanidine, which is a muscle relaxant antispasmodic medication that has been shown to help some people with acute back pain.  If you start to have worsening your symptoms, incontinence, anesthesia around your groin or thighs please notify us immediately.

## 2020-10-17 ENCOUNTER — Other Ambulatory Visit: Payer: Self-pay | Admitting: Family Medicine

## 2020-11-04 ENCOUNTER — Encounter: Payer: Self-pay | Admitting: Family Medicine

## 2020-11-04 ENCOUNTER — Other Ambulatory Visit: Payer: Self-pay

## 2020-11-04 ENCOUNTER — Ambulatory Visit (INDEPENDENT_AMBULATORY_CARE_PROVIDER_SITE_OTHER): Payer: 59 | Admitting: Family Medicine

## 2020-11-04 DIAGNOSIS — M542 Cervicalgia: Secondary | ICD-10-CM

## 2020-11-04 DIAGNOSIS — M79671 Pain in right foot: Secondary | ICD-10-CM

## 2020-11-04 DIAGNOSIS — M79673 Pain in unspecified foot: Secondary | ICD-10-CM | POA: Insufficient documentation

## 2020-11-04 MED ORDER — BACLOFEN 10 MG PO TABS: 10.0000 mg | ORAL_TABLET | Freq: Three times a day (TID) | ORAL | 0 refills | Status: DC

## 2020-11-04 NOTE — Assessment & Plan Note (Signed)
This presentation seems similar to her previous presentation for neck pain.  I am suspicious that her neck pain is related to increased stressors and increased tension along her trapezius.  I have a low low suspicion for vertebral artery dissection or berry aneurysms or other vascular pathology that would require advanced imaging at this time.  Her history and physical exam does not suggest vascular pathology.  Low suspicion for migraine.  We had a lengthy discussion about starting treatment for anxiety that is likely contributing to this muscle tension.  Ultimately, she decided to forego medication for anxiety but is interested in reaching out to her therapist.  She was provided information about finding available therapist in the community. -Baclofen 10 mg 10 tablets prescribed -Provided information on therapy -Work note provided asking for accommodations to avoid neck strain  With regard to her concern about her red Oteri issues and strokes, she was encouraged to return to clinic in 1 month for a longer discussion related to her elevated cholesterol.  May benefit from transitioning to a different cholesterol medication.

## 2020-11-04 NOTE — Progress Notes (Signed)
SUBJECTIVE:   CHIEF COMPLAINT / HPI:   Neck pain Cassandra Osborne reports that she has been having ongoing neck pain for about the past 2 months.  She does not recall any specific trauma.  This seems to be a slowly building/progressing neck pain.  Neck pain is described as a midline achiness or dullness.  She specifically denies any shooting pain or throbbing/pulsating pain.  She has also noted significant tightness have her shoulder muscles.  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  She is suspicious that this is likely related to her neck pain.  She denies head pain or headache-like pain.  She is not having vision changes.  She is also noted building stress at work in addition to personal stress at home related to her relationship with her husband.  She has taken Tylenol for discomfort which provides mild relief although does not seem to cure the problem.  In part, she is worried about her neck because her father suffered a severe stroke from what may have been a vertebral artery dissection.  Her sister has also suffered from neck problems.  She is partly concerned that there may be a hereditary issue and wants to know if she is in danger of any severe consequences.  Right foot pain Similarly, she has noted some dull, foot discomfort in the inside of her right foot for about the past 2 months.  This seems to worsen when she is on her feet all day and improves with rest.  She does not remember any specific trauma.  She is able to reproduce the pain by standing on her foot.  PERTINENT  PMH / PSH: Hypertension, anxiety  OBJECTIVE:   BP 130/80   Pulse (!) 53   Ht 5\' 6"  (1.676 m)   Wt 194 lb 12.8 oz (88.4 kg)   LMP 09/27/2014 (Approximate)   SpO2 99%   BMI 31.44 kg/m    General: Alert and cooperative and appears to be in no acute distress.  Able to walk around the exam room comfortably.  Able to step up onto the exam table  without effort. HEENT: No cervical lymphadenopathy.  No tenderness to palpation of the posterior cervical muscles, paraspinal muscles.  No tenderness with percussion of the cervical spine vertebrae.  Significant tension noted along her trapezius muscles bilaterally.  Negative Spurling test.  Extraocular muscles intact.  Pupils equal, round and reactive to light. Abdomen: Bowel sounds normal. Abdomen soft and non-tender.   Right foot Inspection: No obvious abnormality or deformity.  No evidence of skin changes. Palpation: Mild tenderness with palpation of the plantar surface of her foot along the medial aspect below her medial malleolus. Neurovascularly intact Normal range of motion with dorsiflexion, plantar flexion, inversion, eversion Negative squeeze test of the heel.  Negative squeeze test of the MTPs no tenderness with palpation of the navicular.  No tenderness with palpation of the medial or lateral malleoli.  ASSESSMENT/PLAN:   Foot pain I suspect this is a tension or tendinitis of the tibialis posterior.  I have a low suspicion for stress fracture or bony abnormality although arthritis of the ankle is certainly a possibility.  She was advised to use ibuprofen as needed for discomfort and to try using bilateral foot inserts for the next month.  Neck pain This presentation seems similar to her previous presentation for neck pain.  I am suspicious that her neck pain is  related to increased stressors and increased tension along her trapezius.  I have a low low suspicion for vertebral artery dissection or berry aneurysms or other vascular pathology that would require advanced imaging at this time.  Her history and physical exam does not suggest vascular pathology.  Low suspicion for migraine.  We had a lengthy discussion about starting treatment for anxiety that is likely contributing to this muscle tension.  Ultimately, she decided to forego medication for anxiety but is interested in reaching  out to her therapist.  She was provided information about finding available therapist in the community. -Baclofen 10 mg 10 tablets prescribed -Provided information on therapy -Work note provided asking for accommodations to avoid neck strain  With regard to her concern about her red Oteri issues and strokes, she was encouraged to return to clinic in 1 month for a longer discussion related to her elevated cholesterol.  May benefit from transitioning to a different cholesterol medication.     Cassandra Haymaker, MD [place]

## 2020-11-04 NOTE — Addendum Note (Signed)
Addended by: Grant Ruts on: 11/04/2020 06:16 PM   Modules accepted: Orders

## 2020-11-04 NOTE — Patient Instructions (Addendum)
It was wonderful to see you today.  Please bring ALL of your medications with you to every visit.   Today we talked about:  - Treat your self to a massage for your neck  - We will keep a close eye on your neck pain  -Please come back to clinic in the next month or so for a longer discussion about your cholesterol.  -Consider buying an over-the-counter shoe insert to help with your foot pain.  I do not think any foot x-rays are necessary at this time.   Thank you for choosing [facility].   Please call [phone] with any questions about today's appointment.  Please be sure to schedule follow up at the front  desk before you leave today.   Matilde Haymaker  MD  Family Medicine     Therapy and Counseling Resources Most providers on this list will take Medicaid. Patients with commercial insurance or Medicare should contact their insurance company to get a list of in network providers.  [facility]:Psychiatry and Counseling [address] [facility] [place]. [place], [place] [address] [place]  [address], [place], [place] [address]      [place] [address]  [place], [place] [address] [phone]  [place] [address] [place]  [place], [place] [address]       [phone]      Jinny Blossom [facility] [address] [place], [place]  Family Solutions:  [place]. [place] [phone]  [facility]:  [place] [address] [phone]  [facility] (under & uninsured) [address] [place] [phone]    [email or web address]    [place] [address] . [place]    [phone]  [facility] [place]     Phone:  [phone]     [place]  [place] [address]      [place], [place] ext [place]: [place], [place], [place]   [place] ([place] therapist) [email or web address]  [place] [address]   [place] [address]    [phone]    [facility]   [address],  [place], [place]   [place]  [place]  [facility]  [address] [place], [place]        [phone]  Open Access/Walk In Clinic under & uninsured  [facility]  [address] [place], [place] Crisis [phone]  [facility] the [place],  ([place])   [place], [place]: [phone]) 8:30 - 12; 1 - 2:30  Family Service of the [place],  [place], [place]    ([phone]):8:30 - 12; 2 - 3PM  [facility] [employer],  [address]  [place]; [phone]):   Mon - Fri 8 AM - 5 PM  [facility] [place]  MWF 12:30 to 3:00 or call to schedule an appointment  [phone]  Specific Provider options Psychology Today  [email or web address] 1. click on find a therapist  2. enter your zip  code 3. left side and select or tailor a therapist for your specific need.   [facility] Provider Directory [email or web address]  (Medicaid)   Follow all drop down to find a provider  [place] [phone] or [email or web address] [address], [place], [place] Recovery support and educational   24- Hour Availability:  .  [facility] Kitchen [facility]  . [place], [place] Crisis [phone]  . Family Service of the [employer] [phone]  [facility] Crisis Service  [phone]   . [place]  [phone] (after hours)  . Therapeutic Alternative/Mobile Crisis   [phone]  . [place] National Suicide Hotline  470-471-7039 ([place])  . Call 911 or go to emergency room  . [employer]  [phone]);  [place] and [employer]   . [facility] ACCESS  [phone]); [place], [place], [place], [place], [place], [place], [place]

## 2020-11-04 NOTE — Assessment & Plan Note (Signed)
I suspect this is a tension or tendinitis of the tibialis posterior.  I have a low suspicion for stress fracture or bony abnormality although arthritis of the ankle is certainly a possibility.  She was advised to use ibuprofen as needed for discomfort and to try using bilateral foot inserts for the next month.

## 2021-02-19 IMAGING — CR LUMBAR SPINE - COMPLETE 4+ VIEW
5 series · 5 of 5 positions shown · non-contrast
Comparison: Lumbar MRI September 17, 2013.

CLINICAL DATA: Lumbago with right-sided radicular symptoms

EXAM:
LUMBAR SPINE - COMPLETE 4+ VIEW

[t lumbar spine ap]
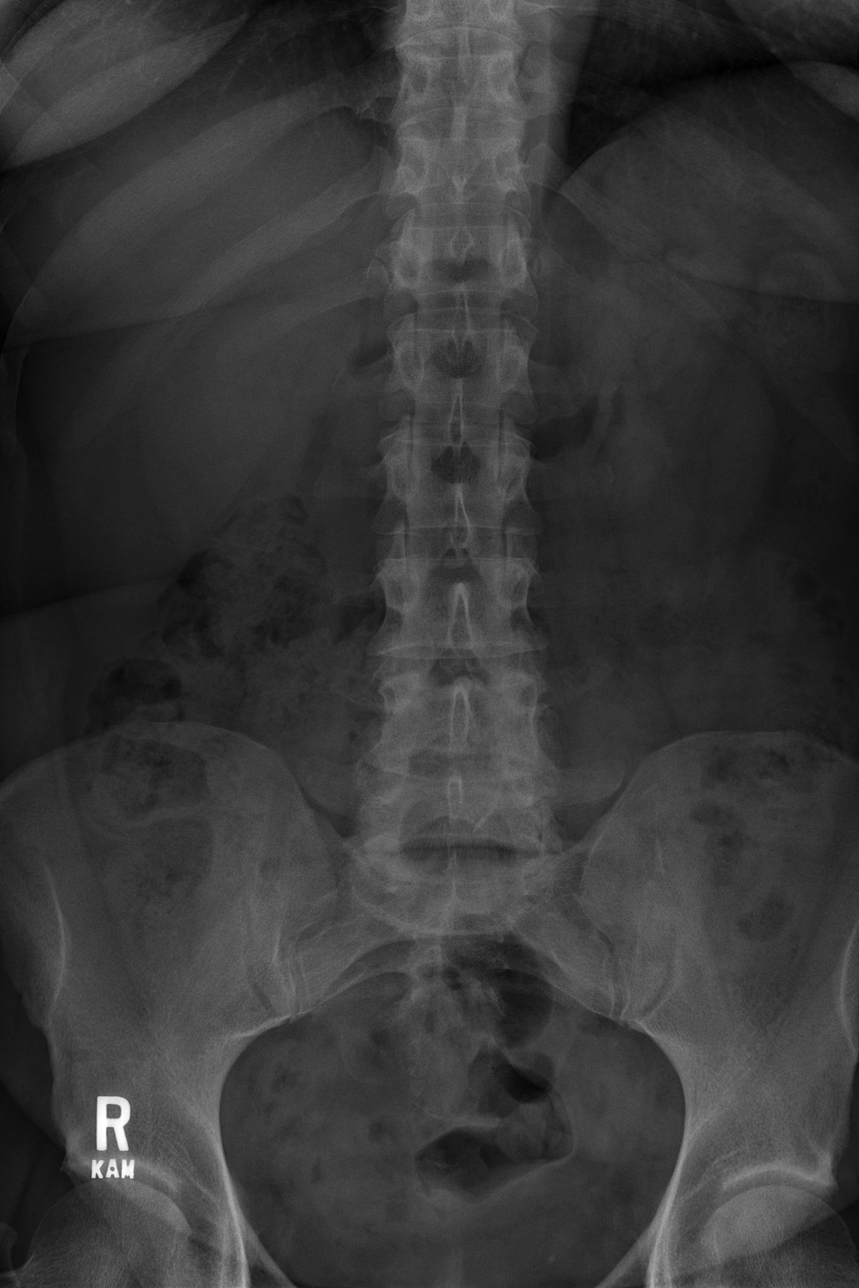

[t lumbar spine obl (1 of 2)]
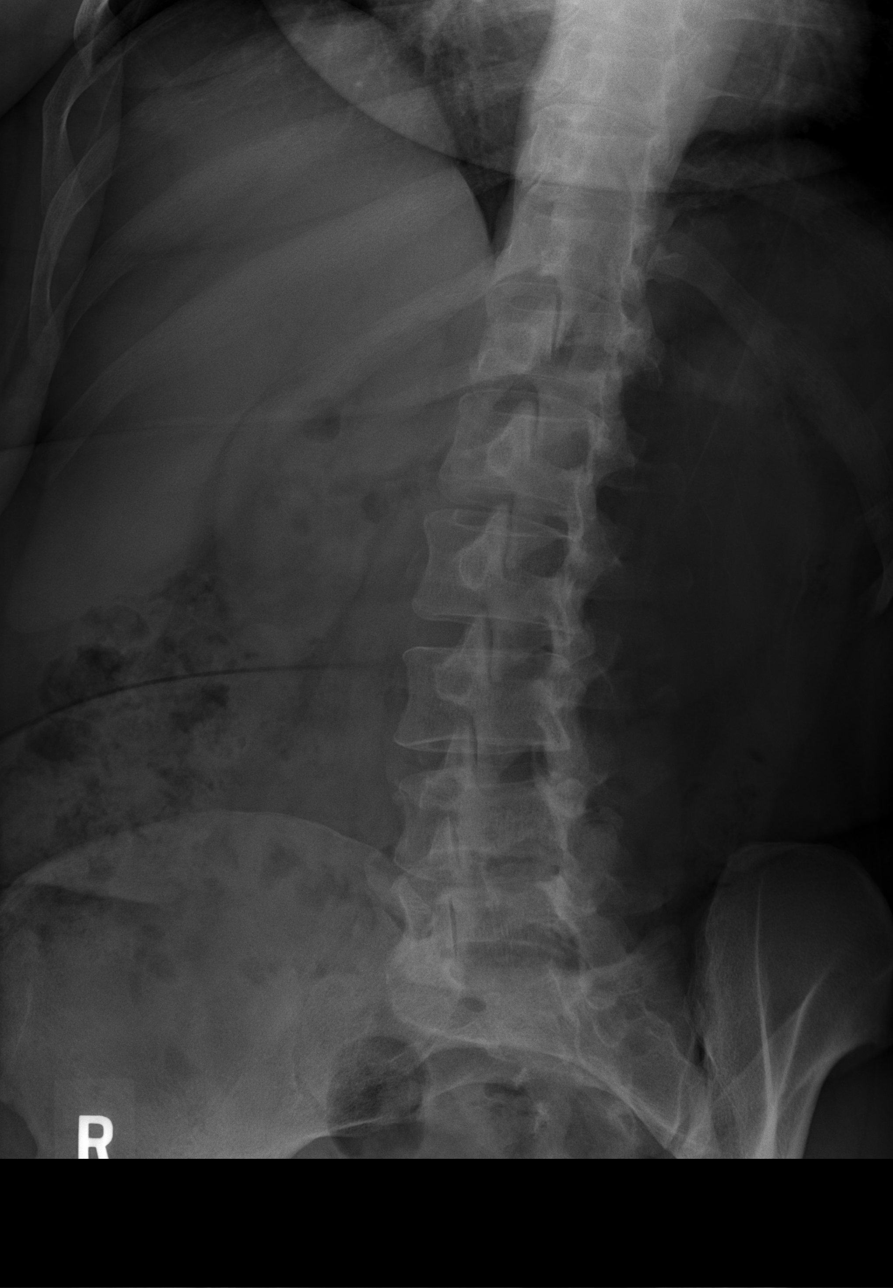

[t lumbar spine obl (2 of 2)]
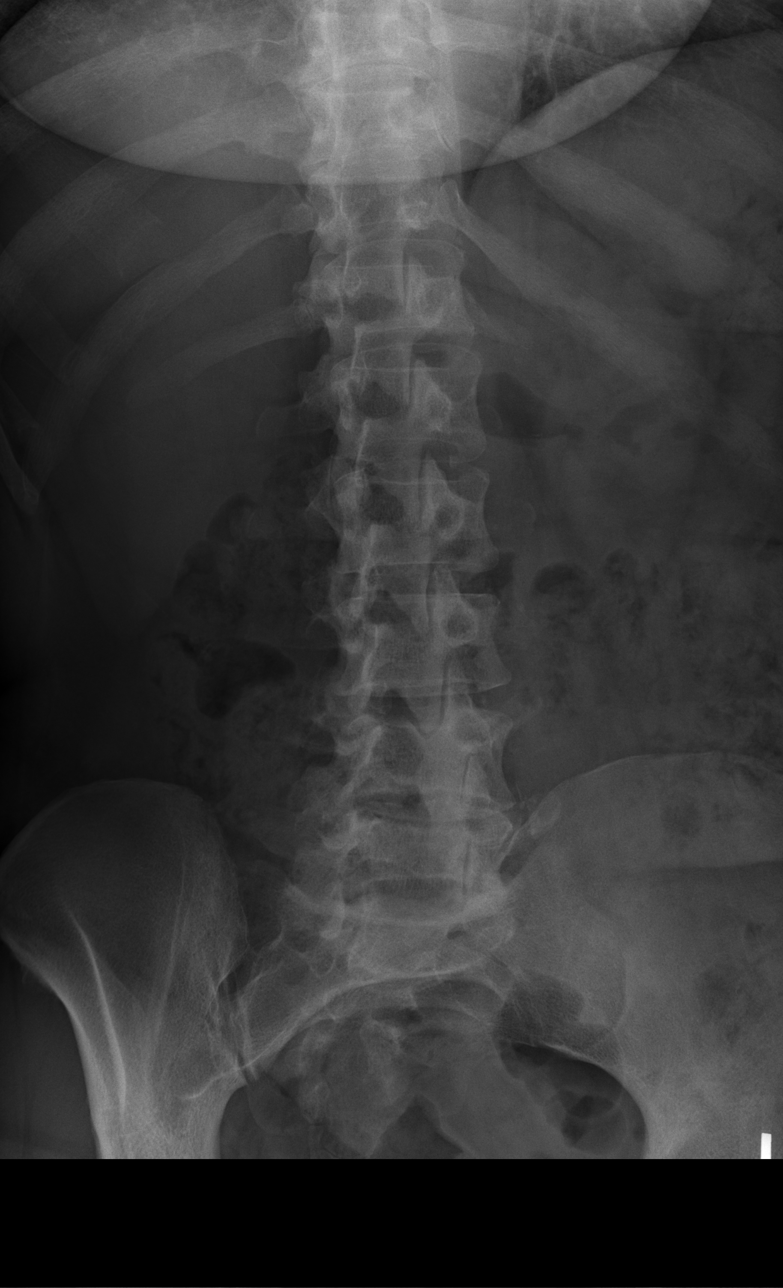

[t lumbar spine lat]
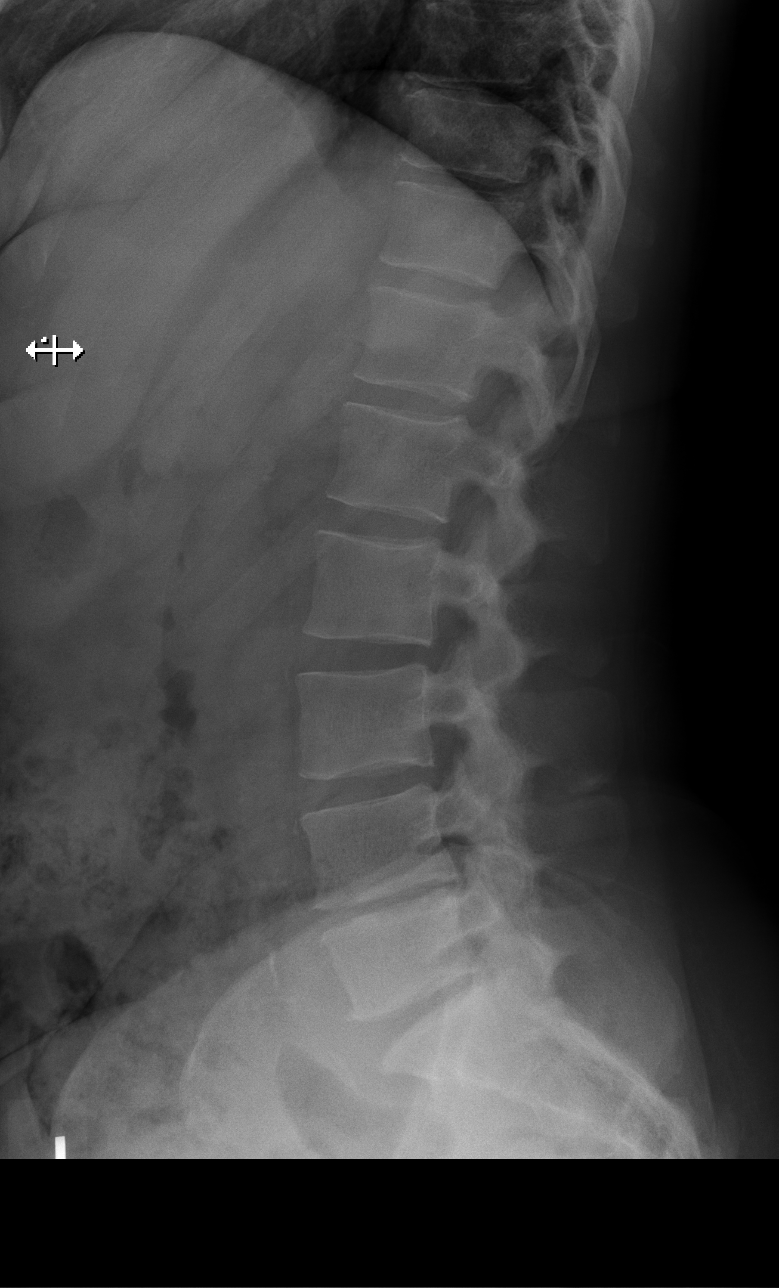

[t lumbar l-5 s-1 spot]
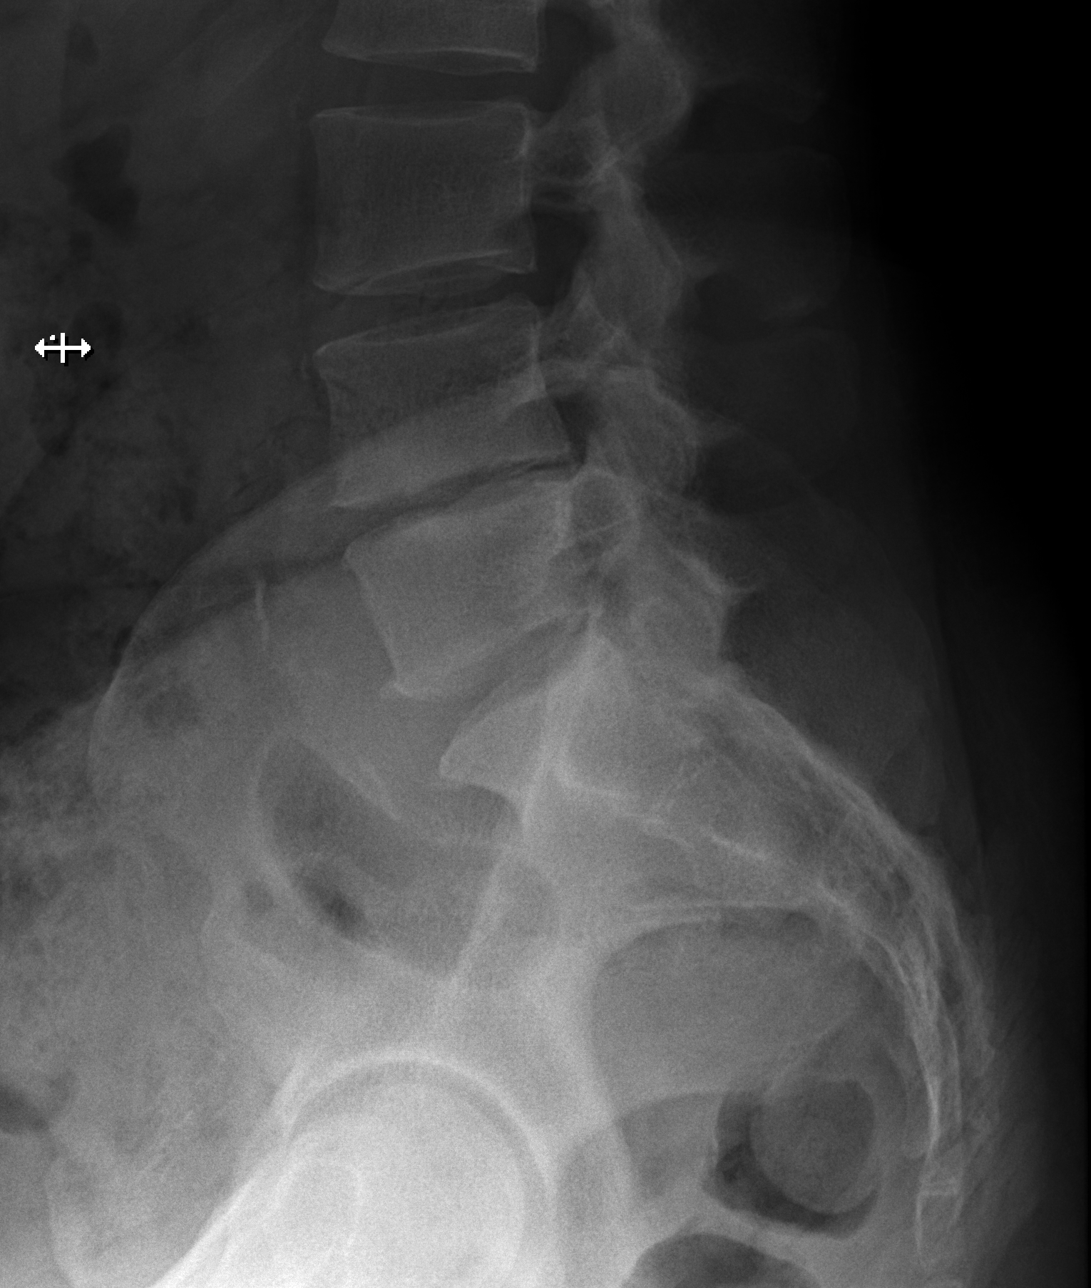

[5 of 5 positions shown; findings below may reference images not displayed]

FINDINGS: Frontal, lateral, spot lumbosacral lateral, and bilateral oblique
views were obtained. There are 5 non-rib-bearing lumbar type
vertebral bodies. There is no fracture or spondylolisthesis. There
is severe disc space narrowing at L4-5 with moderate disc space
narrowing at L5-S1. Other disc spaces appear unremarkable. There is
facet osteoarthritic change at L4-5 and L5-S1 bilaterally.
IMPRESSION: Osteoarthritic change at L4-5 and L5-S1 with disc space narrowing
greatest at L4-5. No fracture or spondylolisthesis.

## 2021-06-21 ENCOUNTER — Encounter (HOSPITAL_COMMUNITY): Payer: Self-pay | Admitting: Emergency Medicine

## 2021-06-21 ENCOUNTER — Other Ambulatory Visit: Payer: Self-pay

## 2021-06-21 ENCOUNTER — Ambulatory Visit (HOSPITAL_COMMUNITY)
Admission: EM | Admit: 2021-06-21 | Discharge: 2021-06-21 | Disposition: A | Discharge status: Home / Self Care | Payer: 59 | Attending: Student | Admitting: Student

## 2021-06-21 DIAGNOSIS — Z1152 Encounter for screening for COVID-19: Secondary | ICD-10-CM | POA: Diagnosis not present

## 2021-06-21 DIAGNOSIS — J069 Acute upper respiratory infection, unspecified: Secondary | ICD-10-CM | POA: Insufficient documentation

## 2021-06-21 MED ORDER — TRIAMCINOLONE ACETONIDE 0.5 % EX OINT: 1.0000 "application " | TOPICAL_OINTMENT | Freq: Two times a day (BID) | CUTANEOUS | 0 refills | Status: AC

## 2021-06-21 NOTE — ED Provider Notes (Signed)
[place]    CSN: [id number] Arrival date & time: 06/21/21  1602      History   Chief Complaint Chief Complaint  Patient presents with   Fever   Chills   Sore Throat    HPI Cassandra Osborne is a Cassandra Osborne presenting with fevers, chills, sore throat for about 4 days.  Medical history GERD.  Endorses fever/chills, body aches, sore throat, nonproductive cough, headaches, malaise.  Has not tried any medications for the symptoms.  States that she thinks she is sick because she choked on rice over a week ago.  She does note that any throat pain or discomfort had completely resolved before the viral symptoms started.  Denied any shortness of breath during or following the incident.  Also states that she has an itchy rash on her right arm and torso, denies outdoor exposure, new products, new fragrances, etc.  Denies shortness of breath, chest pain, dizziness, weakness.  Has not monitor temperature at home.  HPI  Past Medical History:  Diagnosis Date   High cholesterol    Hypertension    No pertinent past medical history     Patient Active Problem List   Diagnosis Date Noted   Foot pain 11/04/2020   Acute right-sided low back pain without sciatica 07/29/2020   Neck pain 02/04/2020   GERD (gastroesophageal reflux disease) 02/04/2020   Anxiety attack 11/02/2019   Vitamin D deficiency 09/28/2019   Balance problem 06/15/2019   HTN (hypertension) 10/04/2018   Dyspepsia 11/25/2017   Hyperlipidemia 12/19/2015   Prediabetes 12/19/2015    Past Surgical History:  Procedure Laterality Date   LUMBAR LAMINECTOMY/DECOMPRESSION MICRODISCECTOMY  09/18/2012   Procedure: LUMBAR LAMINECTOMY/DECOMPRESSION MICRODISCECTOMY 1 LEVEL;  Surgeon: Winfield Cunas, MD;  Location: [facility];  Service: Neurosurgery;  Laterality: Left;  Left Lumbar Four-Five Diskectomy    OB History     Gravida  2   Para  2   Term  2   Preterm      AB      Living  2      SAB      IAB       Ectopic      Multiple      Live Births  2            Home Medications    Prior to Admission medications   Medication Sig Start Date End Date Taking? Authorizing Provider  amLODipine (NORVASC) 5 MG tablet TAKE 1 TABLET BY MOUTH EVERY DAY 10/20/20   Lilland, Alana, DO  baclofen (LIORESAL) 10 MG tablet Take 1 tablet (10 mg total) by mouth 3 (three) times daily. 11/04/20   Matilde Haymaker, MD  clotrimazole-betamethasone (LOTRISONE) cream Apply 1 application topically 2 (two) times daily. X 2 weeks 01/30/20   Jacilyn, [place], DO  diclofenac Sodium (VOLTAREN) 1 % GEL Apply 4 g topically 4 (four) times daily. 01/30/20   Shantoya, [place], DO  fluticasone (FLONASE) 50 MCG/ACT nasal spray fluticasone propionate 50 mcg/actuation nasal spray,suspension  USE 1 SPRAY IN EACH NOSTRIL DAILY FOR 30 DAYS.    [provider]  simvastatin (ZOCOR) 20 MG tablet Take 1 tablet (20 mg total) by mouth at bedtime. 02/01/20   Anedra, [place], DO  triamcinolone cream (KENALOG) 0.1 % APPLY TO AFFECTED AREA SPARINGLY EVERY MORNING AS NEEDED IF FLARE, MAY USE TWICE A DAY 03/20/18   Mayo, Pete Pelt, MD  Vitamin D, Ergocalciferol, (DRISDOL) 1.25 MG (50000 UNIT) CAPS capsule TAKE 1  CAPSULE BY MOUTH EVERY 7 DAYS ON SUNDAY 05/05/20   Lilland, Lorrin Goodell, DO    Family History Family History  Problem Relation Age of Onset   Stroke Father    Diabetes Father    Hyperlipidemia Father    Hypertension Mother    Cancer Other    Breast cancer Maternal Aunt    Colon cancer Neg Hx    Esophageal cancer Neg Hx    Stomach cancer Neg Hx    Rectal cancer Neg Hx     Social History Social History   Tobacco Use   Smoking status: Former    Types: Cigarettes   Smokeless tobacco: Never   Tobacco comments:       Substance Use Topics   Alcohol use: No   Drug use: No     Allergies   Septra [sulfamethoxazole-trimethoprim]   Review of Systems Review of Systems  Constitutional:  Negative for appetite change, chills and  fever.  HENT:  Positive for congestion and sore throat. Negative for ear pain, rhinorrhea, sinus pressure and sinus pain.   Eyes:  Negative for redness and visual disturbance.  Respiratory:  Positive for cough. Negative for chest tightness, shortness of breath and wheezing.   Cardiovascular:  Negative for chest pain and palpitations.  Gastrointestinal:  Negative for abdominal pain, constipation, diarrhea, nausea and vomiting.  Genitourinary:  Negative for dysuria, frequency and urgency.  Musculoskeletal:  Positive for myalgias.  Neurological:  Negative for dizziness, weakness and headaches.  Psychiatric/Behavioral:  Negative for confusion.   All other systems reviewed and are negative.   Physical Exam Triage Vital Signs ED Triage Vitals  Enc Vitals Group     BP 06/21/21 1657 (!) 153/88     Pulse Rate 06/21/21 1657 (!) Cassandra     Resp 06/21/21 1657 18     Temp 06/21/21 1657 99.3 F (37.4 C)     Temp Source 06/21/21 1657 Oral     SpO2 06/21/21 1657 97 %     Weight --      Height --      Head Circumference --      Peak Flow --      Pain Score 06/21/21 1656 9     Pain Loc --      Pain Edu? --      Excl. in [place]? --    No data found.  Updated Vital Signs BP (!) 153/88 (BP Location: Right Arm)   Pulse (!) Cassandra   Temp 99.3 F (37.4 C) (Oral)   Resp 18   LMP 09/27/2014 (Approximate)   SpO2 97%   Visual Acuity Right Eye Distance:   Left Eye Distance:   Bilateral Distance:    Right Eye Near:   Left Eye Near:    Bilateral Near:     Physical Exam Vitals reviewed.  Constitutional:      General: She is not in acute distress.    Appearance: Normal appearance. She is not ill-appearing.  HENT:     Head: Normocephalic and atraumatic.     Right Ear: Tympanic membrane, ear canal and external ear normal. No tenderness. No middle ear effusion. There is no impacted cerumen. Tympanic membrane is not perforated, erythematous, retracted or bulging.     Left Ear: Tympanic membrane, ear  canal and external ear normal. No tenderness.  No middle ear effusion. There is no impacted cerumen. Tympanic membrane is not perforated, erythematous, retracted or bulging.     Nose: Nose normal. No congestion.  Mouth/Throat:     Mouth: Mucous membranes are moist.     Pharynx: Uvula midline. No oropharyngeal exudate or posterior oropharyngeal erythema.     Tonsils: No tonsillar exudate.     Comments: On exam, uvula is midline, she is tolerating her secretions without difficulty, there is no trismus, no drooling, she has normal phonation  Eyes:     Extraocular Movements: Extraocular movements intact.     Pupils: Pupils are equal, round, and reactive to light.  Cardiovascular:     Rate and Rhythm: Normal rate and regular rhythm.     Heart sounds: Normal heart sounds.  Pulmonary:     Effort: Pulmonary effort is normal.     Breath sounds: Normal breath sounds. No decreased breath sounds, wheezing, rhonchi or rales.     Comments: Occ cough Abdominal:     Palpations: Abdomen is soft.     Tenderness: There is no abdominal tenderness. There is no guarding or rebound.  Lymphadenopathy:     Cervical: No cervical adenopathy.     Right cervical: No superficial cervical adenopathy.    Left cervical: No superficial cervical adenopathy.  Skin:    Comments: R forearm with scattered maculopapular rash, no warmth or discharge. No rash on torso or face.  Neurological:     General: No focal deficit present.     Mental Status: She is alert and oriented to person, place, and time.  Psychiatric:        Mood and Affect: Mood normal.        Behavior: Behavior normal.        Thought Content: Thought content normal.        Judgment: Judgment normal.     UC Treatments / Results  Labs (all labs ordered are listed, but only abnormal results are displayed) Labs Reviewed  SARS CORONAVIRUS 2 (TAT 6-24 HRS)    EKG   Radiology No results found.  Procedures Procedures (including critical care  time)  Medications Ordered in UC Medications - No data to display  Initial Impression / Assessment and Plan / UC Course  I have reviewed the triage vital signs and the nursing notes.  Pertinent labs & imaging results that were available during my care of the patient were reviewed by me and considered in my medical decision making (see chart for details).     This patient is a very pleasant Cassandra y.o. year old Osborne presenting with viral URI with cough and rash. Today this pt is afebrile nontachycardic nontachypneic, oxygenating well on room air, no wheezes rhonchi or rales.   Covid PCR sent.  Prefers over-the-counter medications for management of viral symptoms.   For nonspecific contact dermatitis, triamcinolone ointment sent.  Work note provided. ED return precautions discussed. Patient verbalizes understanding and agreement.    Final Clinical Impressions(s) / UC Diagnoses   Final diagnoses:  Viral URI with cough  Encounter for screening for COVID-19     Discharge Instructions      -Over-the-counter medications for symptomatic relief including Tylenol and ibuprofen for headaches. -With a virus, you're typically contagious for 5-7 days, or as long as you're having fevers.     ED Prescriptions   None    PDMP not reviewed this encounter.   Hazel Sams, PA-C 06/21/21 1736

## 2021-06-21 NOTE — Discharge Instructions (Addendum)
-  Over-the-counter medications for symptomatic relief including Tylenol and ibuprofen for headaches. -With a virus, you're typically contagious for 5-7 days, or as long as you're having fevers.

## 2021-06-21 NOTE — ED Triage Notes (Signed)
Pt reports since Thursday having fevers, chills, back pain, body pains, sore throat. Has rash on right arm and torso that itches.  Reports she chocked on rice over week ago.

## 2021-06-22 LAB — SARS CORONAVIRUS 2 (TAT 6-24 HRS): SARS Coronavirus 2: POSITIVE — AB

## 2021-06-28 ENCOUNTER — Encounter (HOSPITAL_COMMUNITY): Payer: Self-pay | Admitting: Emergency Medicine

## 2021-06-28 ENCOUNTER — Ambulatory Visit (HOSPITAL_COMMUNITY)
Admission: EM | Admit: 2021-06-28 | Discharge: 2021-06-28 | Disposition: A | Discharge status: Home / Self Care | Payer: 59 | Attending: Emergency Medicine | Admitting: Emergency Medicine

## 2021-06-28 ENCOUNTER — Other Ambulatory Visit: Payer: Self-pay

## 2021-06-28 DIAGNOSIS — R051 Acute cough: Secondary | ICD-10-CM

## 2021-06-28 DIAGNOSIS — U071 COVID-19: Secondary | ICD-10-CM

## 2021-06-28 MED ORDER — DEXAMETHASONE 6 MG PO TABS: 6.0000 mg | ORAL_TABLET | Freq: Two times a day (BID) | ORAL | 0 refills | Status: AC

## 2021-06-28 NOTE — Discharge Instructions (Signed)
As we discussed, with COVID-19, you can have a lot of inflammation in your lungs, muscle tissue and brain.  Dexamethasone is a steroid that is widely used for COVID-19 infections, reduce inflammation and relieve symptoms.  Please take 1 tablet in the morning 1 tablet in the evening for the next 5 days.

## 2021-06-28 NOTE — ED Triage Notes (Signed)
Pt dx with Covid last week. She has productive cough with green mucus, chest congestion, and HA.

## 2021-06-28 NOTE — ED Provider Notes (Signed)
[place]    CSN: [id number] Arrival date & time: 06/28/21  1002      History   Chief Complaint Chief Complaint  Patient presents with   Cough   Headache   Chest Congestion     HPI Cassandra Osborne is a 56 y.o. female.   Patient tested positive for COVID-19 on June 21, 2021.  Patient states that she has a cough today that is productive of green sputum starting yesterday.  Patient states it also hurts a little bit in her chest when she coughs.  Patient denies sore throat, body aches, fever, chills, nausea, vomiting, diarrhea, headache, loss of taste or smell, sinus pressure, purulent drainage from sinuses.  Patient states she has been taking ibuprofen and Tylenol at home.  Patient states she has not tried anything for cough.  The history is provided by the patient.   Past Medical History:  Diagnosis Date   High cholesterol    Hypertension    No pertinent past medical history     Patient Active Problem List   Diagnosis Date Noted   Foot pain 11/04/2020   Acute right-sided low back pain without sciatica 07/29/2020   Neck pain 02/04/2020   GERD (gastroesophageal reflux disease) 02/04/2020   Anxiety attack 11/02/2019   Vitamin D deficiency 09/28/2019   Balance problem 06/15/2019   HTN (hypertension) 10/04/2018   Dyspepsia 11/25/2017   Hyperlipidemia 12/19/2015   Prediabetes 12/19/2015    Past Surgical History:  Procedure Laterality Date   LUMBAR LAMINECTOMY/DECOMPRESSION MICRODISCECTOMY  09/18/2012   Procedure: LUMBAR LAMINECTOMY/DECOMPRESSION MICRODISCECTOMY 1 LEVEL;  Surgeon: Winfield Cunas, MD;  Location: [facility];  Service: Neurosurgery;  Laterality: Left;  Left Lumbar Four-Five Diskectomy    OB History     Gravida  2   Para  2   Term  2   Preterm      AB      Living  2      SAB      IAB      Ectopic      Multiple      Live Births  2            Home Medications    Prior to Admission medications   Medication  Sig Start Date End Date Taking? Authorizing Provider  amLODipine (NORVASC) 5 MG tablet TAKE 1 TABLET BY MOUTH EVERY DAY 10/20/20  Yes Lilland, Alana, DO  baclofen (LIORESAL) 10 MG tablet Take 1 tablet (10 mg total) by mouth 3 (three) times daily. 11/04/20  Yes Matilde Haymaker, MD  dexamethasone (DECADRON) 6 MG tablet Take 1 tablet (6 mg total) by mouth 2 (two) times daily with a meal for 5 days. 06/28/21 07/03/21 Yes Lynden Oxford Scales, PA-C  simvastatin (ZOCOR) 20 MG tablet Take 1 tablet (20 mg total) by mouth at bedtime. 02/01/20  Yes Enid Derry, [place], DO  triamcinolone ointment (KENALOG) 0.5 % Apply 1 application topically 2 (two) times daily for 7 days. Avoid face and genitals 06/21/21 06/28/21 Yes Hazel Sams, PA-C  Vitamin D, Ergocalciferol, (DRISDOL) 1.25 MG (50000 UNIT) CAPS capsule TAKE 1 CAPSULE BY MOUTH EVERY 7 DAYS ON SUNDAY 05/05/20  Yes Lilland, Alana, DO  clotrimazole-betamethasone (LOTRISONE) cream Apply 1 application topically 2 (two) times daily. X 2 weeks 01/30/20   Donneisha, [place], DO  diclofenac Sodium (VOLTAREN) 1 % GEL Apply 4 g topically 4 (four) times daily. 01/30/20   Saray, [place], DO  fluticasone (FLONASE) 50 MCG/ACT nasal spray fluticasone  propionate 50 mcg/actuation nasal spray,suspension  USE 1 SPRAY IN EACH NOSTRIL DAILY FOR 30 DAYS.    [provider]    Family History Family History  Problem Relation Age of Onset   Stroke Father    Diabetes Father    Hyperlipidemia Father    Hypertension Mother    Cancer Other    Breast cancer Maternal Aunt    Colon cancer Neg Hx    Esophageal cancer Neg Hx    Stomach cancer Neg Hx    Rectal cancer Neg Hx     Social History Social History   Tobacco Use   Smoking status: Former    Types: Cigarettes   Smokeless tobacco: Never   Tobacco comments:       Vaping Use   Vaping Use: Never used  Substance Use Topics   Alcohol use: No   Drug use: No     Allergies   Septra [sulfamethoxazole-trimethoprim]   Review  of Systems Review of Systems Per HPI  Physical Exam Triage Vital Signs ED Triage Vitals [06/28/21 1023]  Enc Vitals Group     BP (!) 151/82     Pulse Rate (!) 51     Resp      Temp 97.8 F (36.6 C)     Temp Source Oral     SpO2 98 %     Weight      Height      Head Circumference      Peak Flow      Pain Score 0     Pain Loc      Pain Edu?      Excl. in [place]?    No data found.  Updated Vital Signs BP (!) 151/82 (BP Location: Left Arm)   Pulse (!) 51   Temp 97.8 F (36.6 C) (Oral)   LMP 09/27/2014 (Approximate)   SpO2 98%   Visual Acuity Right Eye Distance:   Left Eye Distance:   Bilateral Distance:    Right Eye Near:   Left Eye Near:    Bilateral Near:     Physical Exam Vitals and nursing note reviewed.  Constitutional:      Appearance: Normal appearance.  HENT:     Head: Normocephalic and atraumatic.     Right Ear: Tympanic membrane, ear canal and external ear normal.     Left Ear: Tympanic membrane, ear canal and external ear normal.     Nose: Nose normal.     Mouth/Throat:     Mouth: Mucous membranes are moist.     Pharynx: Oropharynx is clear.  Eyes:     Extraocular Movements: Extraocular movements intact.     Conjunctiva/sclera: Conjunctivae normal.     Pupils: Pupils are equal, round, and reactive to light.  Cardiovascular:     Rate and Rhythm: Normal rate and regular rhythm.     Pulses: Normal pulses.     Heart sounds: Normal heart sounds.  Pulmonary:     Effort: Pulmonary effort is normal.     Breath sounds: Normal breath sounds.  Abdominal:     General: Abdomen is flat. Bowel sounds are normal.     Palpations: Abdomen is soft.  Musculoskeletal:        General: Normal range of motion.     Cervical back: Normal range of motion and neck supple.  Skin:    General: Skin is warm and dry.  Neurological:     General: No focal deficit present.  Mental Status: She is alert and oriented to person, place, and time. Mental status is at baseline.   Psychiatric:        Mood and Affect: Mood normal.        Behavior: Behavior normal.     UC Treatments / Results  Labs (all labs ordered are listed, but only abnormal results are displayed) Labs Reviewed - No data to display  EKG   Radiology No results found.  Procedures Procedures (including critical care time)  Medications Ordered in UC Medications - No data to display  Initial Impression / Assessment and Plan / UC Course  I have reviewed the triage vital signs and the nursing notes.  Pertinent labs & imaging results that were available during my care of the patient were reviewed by me and considered in my medical decision making (see chart for details).     Physical exam today is unremarkable.  Patient states she has not taken her blood pressure medication yet this morning.  Patient states she is wondering if she can get some pills for her COVID today.  We will start patient on Decadron 6 mg twice daily for 5 days.  Patient has been advised to continue quarantine until symptoms have resolved.  Patient provided with note for work.  Patient verbalized understanding and agreement with plan.  All questions were addressed during visit. Final Clinical Impressions(s) / UC Diagnoses   Final diagnoses:  COVID-19  Acute cough     Discharge Instructions      As we discussed, with COVID-19, you can have a lot of inflammation in your lungs, muscle tissue and brain.  Dexamethasone is a steroid that is widely used for COVID-19 infections, reduce inflammation and relieve symptoms.  Please take 1 tablet in the morning 1 tablet in the evening for the next 5 days.     ED Prescriptions     Medication Sig Dispense Auth. Provider   dexamethasone (DECADRON) 6 MG tablet Take 1 tablet (6 mg total) by mouth 2 (two) times daily with a meal for 5 days. 10 tablet Lynden Oxford Scales, PA-C      PDMP not reviewed this encounter.   Lynden Oxford Scales, PA-C 06/28/21 1053

## 2021-06-29 ENCOUNTER — Ambulatory Visit: Payer: 59

## 2021-07-09 ENCOUNTER — Ambulatory Visit (INDEPENDENT_AMBULATORY_CARE_PROVIDER_SITE_OTHER): Payer: 59 | Admitting: Family Medicine

## 2021-07-09 ENCOUNTER — Other Ambulatory Visit: Payer: Self-pay

## 2021-07-09 VITALS — BP 152/80 | HR 75

## 2021-07-09 DIAGNOSIS — R5383 Other fatigue: Secondary | ICD-10-CM | POA: Diagnosis not present

## 2021-07-09 DIAGNOSIS — M545 Low back pain, unspecified: Secondary | ICD-10-CM

## 2021-07-09 DIAGNOSIS — R1310 Dysphagia, unspecified: Secondary | ICD-10-CM

## 2021-07-09 DIAGNOSIS — R071 Chest pain on breathing: Secondary | ICD-10-CM | POA: Diagnosis not present

## 2021-07-09 DIAGNOSIS — I10 Essential (primary) hypertension: Secondary | ICD-10-CM

## 2021-07-09 NOTE — Patient Instructions (Addendum)
It was nice seeing you today!  Try Tylenol or lidocaine patches for your back. You can also try heat or ice.  We will call you to schedule the CT scan and the swallow study.  Come back to get your blood work done.  Stay well, Zola Button, MD [place] [phone]

## 2021-07-09 NOTE — Progress Notes (Signed)
    SUBJECTIVE:   CHIEF COMPLAINT / HPI:   Patient recently tested positive for COVID-19 on 9/25, symptoms started 9/22.  She returned to urgent care 10/2 and had reported productive cough and chest discomfort when coughing.  She was treated with a 5-day course of dexamethasone.  Initial symptoms of productive cough of green sputum, SOB, fever as high as 101F, body aches, congestion, loss of smell, diffuse rash. She only took 1 day of dexamethasone but started coughing up blood that night.  Therefore, she stopped taking dexamethasone because she thought it might be related.  Also had nosebleeding which persisted for a few days. Still does not have her taste back and still coughing, though cough has been improving and is no longer productive. She has had lower back pain radiating around to her abdomen bilaterally for the past 2 weeks which is her primary concern.  She took an Advil today with no significant relief.  Denies leg weakness, saddle anesthesia, bowel/bladder incontinence.  She has not had any injuries.  She has also reports dysphagia only when eating rice for the past month.  She feels there is something off of her digestive system.  She feels food gets stuck in her colon in the right upper quadrant as well and sometimes has pain.  She was interested in further testing such as EGD.  PERTINENT  PMH / PSH: HTN, GERD  OBJECTIVE:   BP (!) 152/80   Pulse 75   LMP 09/27/2014 (Approximate)   SpO2 100%   General: Middle-aged female, NAD CV: RRR, no murmurs Pulm: CTAB, questionable faint rales at the bilateral middle lobes Abdomen: Soft, mild tenderness with palpation along midline abdomen with deep palpation MSK: No tenderness along midline spine and paraspinal muscles, she does have difficulty sitting up from exam table due to pain Neuro: Full strength with hip flexion, knee extension, knee flexion, plantarflexion, and dorsiflexion.  Difficult to obtain patellar reflexes.  Achilles  reflexes symmetric.   ASSESSMENT/PLAN:   HTN (hypertension) Elevated in office today, was not able to address this in depth today.  Recommend follow-up with PCP.   Fatigue Suspect likely postviral from recent COVID infection but will obtain basic labs CBC and CMP to rule out anemia or electrolyte disturbances.  Unable to obtain labs today given time constraints, future orders placed.  Dyspnea Still reporting subjective shortness of breath though maintaining good oxygen saturation with questionable rales on exam.  She did have 1 night of hemoptysis after taking dexamethasone, unclear if this is related.  Given hypercoagulable state from COVID infection and hemoptysis, there is consideration for PE.  Well score is at most 4 if PE is considered a most likely diagnosis.  Shared decision making with patient, she wants to get a CT scan so this was ordered.  Back pain Atraumatic, no red flag signs or symptoms.  Unclear if this is related to COVID infection.  Recommended to continue OTC treatments such as Tylenol, NSAIDs, lidocaine patch.  Back exercises given.  Dysphagia Reports dysphagia to only rice for the past month.  We will start with MBS/SLP for work-up.  Could consider RUQ [place] if negative, could consider gallbladder pathology given location of pain.   Zola Button, MD [place]

## 2021-07-09 NOTE — Assessment & Plan Note (Signed)
Elevated in office today, was not able to address this in depth today.  Recommend follow-up with PCP.

## 2021-07-10 ENCOUNTER — Telehealth (HOSPITAL_COMMUNITY): Payer: Self-pay | Admitting: *Deleted

## 2021-07-10 ENCOUNTER — Telehealth: Payer: Self-pay

## 2021-07-10 NOTE — Telephone Encounter (Signed)
Attempted to contact patient to schedule OP MBS. Left VM. RKEEL 

## 2021-07-10 NOTE — Telephone Encounter (Signed)
Called and scheduled patient for CT Angio Chest w/Cm & or Wo Cm  07/14/2021 [facility] 1630 with arrival at 1615 No liquid 4 hours prior to test.  Prior authorization [id number]  LVM for patient with above information and also to call and schedule a LAB VISIT for future blood work.  Cassandra Osborne, [place]

## 2021-07-14 ENCOUNTER — Ambulatory Visit (HOSPITAL_COMMUNITY): Payer: 59

## 2021-07-16 ENCOUNTER — Telehealth (HOSPITAL_COMMUNITY): Payer: Self-pay | Admitting: *Deleted

## 2021-07-16 ENCOUNTER — Other Ambulatory Visit (INDEPENDENT_AMBULATORY_CARE_PROVIDER_SITE_OTHER): Payer: 59 | Admitting: Family Medicine

## 2021-07-16 ENCOUNTER — Other Ambulatory Visit: Payer: Self-pay

## 2021-07-16 ENCOUNTER — Other Ambulatory Visit: Payer: 59

## 2021-07-16 DIAGNOSIS — M545 Low back pain, unspecified: Secondary | ICD-10-CM

## 2021-07-16 DIAGNOSIS — R5383 Other fatigue: Secondary | ICD-10-CM

## 2021-07-16 LAB — POCT URINALYSIS DIP (CLINITEK)
Bilirubin, UA: NEGATIVE
Blood, UA: NEGATIVE
Glucose, UA: NEGATIVE mg/dL
Leukocytes, UA: NEGATIVE
Nitrite, UA: NEGATIVE
POC PROTEIN,UA: NEGATIVE
Spec Grav, UA: >=1.03 — AB (ref 1.010–1.025)
Urobilinogen, UA: 0.2 E.U./dL
pH, UA: 5 (ref 5.0–8.0)

## 2021-07-16 NOTE — Telephone Encounter (Signed)
2nd attempt to contact patient about scheduling OP MBS. Left VM. RKEEL

## 2021-07-17 LAB — CBC
Hematocrit: 37.9 % (ref 34.0–46.6)
Hemoglobin: 12.5 g/dL (ref 11.1–15.9)
MCH: 25.7 pg — ABNORMAL LOW (ref 26.6–33.0)
MCHC: 33 g/dL (ref 31.5–35.7)
MCV: 78 fL — ABNORMAL LOW (ref 79–97)
Platelets: 223 10*3/uL (ref 150–450)
RBC: 4.86 x10E6/uL (ref 3.77–5.28)
RDW: 13.8 % (ref 11.7–15.4)
WBC: 3.8 10*3/uL (ref 3.4–10.8)

## 2021-07-17 LAB — COMPREHENSIVE METABOLIC PANEL
ALT: 14 IU/L (ref 0–32)
AST: 14 IU/L (ref 0–40)
Albumin/Globulin Ratio: 2.1 (ref 1.2–2.2)
Albumin: 4.6 g/dL (ref 3.8–4.9)
Alkaline Phosphatase: 91 IU/L (ref 44–121)
BUN/Creatinine Ratio: 18 (ref 9–23)
BUN: 14 mg/dL (ref 6–24)
Bilirubin Total: 0.4 mg/dL (ref 0.0–1.2)
CO2: 24 mmol/L (ref 20–29)
Calcium: 9.8 mg/dL (ref 8.7–10.2)
Chloride: 107 mmol/L — ABNORMAL HIGH (ref 96–106)
Creatinine, Ser: 0.76 mg/dL (ref 0.57–1.00)
Globulin, Total: 2.2 g/dL (ref 1.5–4.5)
Glucose: 81 mg/dL (ref 70–99)
Potassium: 4.2 mmol/L (ref 3.5–5.2)
Sodium: 146 mmol/L — ABNORMAL HIGH (ref 134–144)
Total Protein: 6.8 g/dL (ref 6.0–8.5)
eGFR: 91 mL/min/{1.73_m2} (ref >59)

## 2021-07-23 ENCOUNTER — Other Ambulatory Visit (HOSPITAL_COMMUNITY): Payer: Self-pay | Admitting: *Deleted

## 2021-07-23 DIAGNOSIS — R1310 Dysphagia, unspecified: Secondary | ICD-10-CM

## 2021-08-04 ENCOUNTER — Ambulatory Visit (HOSPITAL_COMMUNITY)
Admission: RE | Admit: 2021-08-04 | Discharge: 2021-08-04 | Disposition: A | Discharge status: Home / Self Care | Payer: 59 | Source: Ambulatory Visit | Attending: Family Medicine | Admitting: Family Medicine

## 2021-08-04 ENCOUNTER — Other Ambulatory Visit: Payer: Self-pay

## 2021-08-04 DIAGNOSIS — R1310 Dysphagia, unspecified: Secondary | ICD-10-CM

## 2021-08-06 ENCOUNTER — Telehealth: Payer: Self-pay | Admitting: Family Medicine

## 2021-08-06 NOTE — Telephone Encounter (Signed)
Called patient to discuss swallow study results but patient unavailable.  Left voicemail, recommended that she get in touch with [facility] who did her colonoscopy if she wants further GI work-up.  Can place referral if needed.

## 2021-10-03 ENCOUNTER — Encounter: Payer: Self-pay | Admitting: Gastroenterology

## 2021-10-04 ENCOUNTER — Encounter (HOSPITAL_COMMUNITY): Payer: Self-pay | Admitting: *Deleted

## 2021-10-04 ENCOUNTER — Ambulatory Visit (HOSPITAL_COMMUNITY)
Admission: EM | Admit: 2021-10-04 | Discharge: 2021-10-04 | Disposition: A | Discharge status: Home / Self Care | Payer: 59 | Attending: Internal Medicine | Admitting: Internal Medicine

## 2021-10-04 ENCOUNTER — Other Ambulatory Visit: Payer: Self-pay

## 2021-10-04 ENCOUNTER — Ambulatory Visit (INDEPENDENT_AMBULATORY_CARE_PROVIDER_SITE_OTHER): Payer: 59

## 2021-10-04 DIAGNOSIS — R9431 Abnormal electrocardiogram [ECG] [EKG]: Secondary | ICD-10-CM | POA: Diagnosis not present

## 2021-10-04 DIAGNOSIS — M5010 Cervical disc disorder with radiculopathy, unspecified cervical region: Secondary | ICD-10-CM

## 2021-10-04 DIAGNOSIS — R001 Bradycardia, unspecified: Secondary | ICD-10-CM

## 2021-10-04 DIAGNOSIS — M25512 Pain in left shoulder: Secondary | ICD-10-CM | POA: Diagnosis not present

## 2021-10-04 DIAGNOSIS — M4802 Spinal stenosis, cervical region: Secondary | ICD-10-CM

## 2021-10-04 DIAGNOSIS — M47812 Spondylosis without myelopathy or radiculopathy, cervical region: Secondary | ICD-10-CM | POA: Diagnosis not present

## 2021-10-04 DIAGNOSIS — I10 Essential (primary) hypertension: Secondary | ICD-10-CM

## 2021-10-04 DIAGNOSIS — M898X1 Other specified disorders of bone, shoulder: Secondary | ICD-10-CM

## 2021-10-04 MED ORDER — TIZANIDINE HCL 4 MG PO TABS
4.0000 mg | ORAL_TABLET | Freq: Every day | ORAL | 0 refills | Status: DC
Start: 2021-10-04 — End: 2021-10-14

## 2021-10-04 MED ORDER — PREDNISONE 20 MG PO TABS: ORAL_TABLET | ORAL | 0 refills | Status: DC

## 2021-10-04 NOTE — ED Triage Notes (Signed)
Pt reports pain for 2 weeks . Pt has Lt shoulder pain,Lt arm pain which is worse when moving arm. Pt reports she can not raise her Lt arm above her head due to Pain.Pt also has pain at the Lt collar bone site and Lt neck.

## 2021-10-04 NOTE — Discharge Instructions (Addendum)
Take the prednisone for your musculoskeletal pain.  Follow-up with the spine specialist.  Contact your PCP to set up an appointment with the heart doctor.

## 2021-10-04 NOTE — ED Provider Notes (Signed)
[place]   MRN: [id number] DOB: 21-Jun-1966  Subjective:   Cassandra Osborne is a 56 y.o. female with pmh of prediabetes, HTN presenting for 2-week history of persistent left-sided neck pain that radiates into the trapezius, left shoulder, left arm.  She also feels pain around the clavicle and upper lateral chest.  No falls, trauma, diaphoresis, shortness of breath, heart racing, nausea, vomiting, abdominal pain.  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  No history of heart disease, MI.  No history of stroke.  No current facility-administered medications for this encounter.  Current Outpatient Medications:    amLODipine (NORVASC) 5 MG tablet, TAKE 1 TABLET BY MOUTH EVERY DAY, Disp: 90 tablet, Rfl: 3   baclofen (LIORESAL) 10 MG tablet, Take 1 tablet (10 mg total) by mouth 3 (three) times daily., Disp: 10 each, Rfl: 0   clotrimazole-betamethasone (LOTRISONE) cream, Apply 1 application topically 2 (two) times daily. X 2 weeks, Disp: 30 g, Rfl: 0   diclofenac Sodium (VOLTAREN) 1 % GEL, Apply 4 g topically 4 (four) times daily., Disp: 150 g, Rfl: 1   fluticasone (FLONASE) 50 MCG/ACT nasal spray, fluticasone propionate 50 mcg/actuation nasal spray,suspension  USE 1 SPRAY IN EACH NOSTRIL DAILY FOR 30 DAYS., Disp: , Rfl:    simvastatin (ZOCOR) 20 MG tablet, Take 1 tablet (20 mg total) by mouth at bedtime., Disp: 90 tablet, Rfl: 3   Vitamin D, Ergocalciferol, (DRISDOL) 1.25 MG (50000 UNIT) CAPS capsule, TAKE 1 CAPSULE BY MOUTH EVERY 7 DAYS ON SUNDAY, Disp: 5 capsule, Rfl: 0   Allergies  Allergen Reactions   Septra [Sulfamethoxazole-Trimethoprim] Hives    Past Medical History:  Diagnosis Date   High cholesterol    Hypertension    No pertinent past medical history      Past Surgical History:  Procedure Laterality Date   LUMBAR LAMINECTOMY/DECOMPRESSION MICRODISCECTOMY  09/18/2012   Procedure: LUMBAR LAMINECTOMY/DECOMPRESSION  MICRODISCECTOMY 1 LEVEL;  Surgeon: Winfield Cunas, MD;  Location: [facility];  Service: Neurosurgery;  Laterality: Left;  Left Lumbar Four-Five Diskectomy    Family History  Problem Relation Age of Onset   Stroke Father    Diabetes Father    Hyperlipidemia Father    Hypertension Mother    Cancer Other    Breast cancer Maternal Aunt    Colon cancer Neg Hx    Esophageal cancer Neg Hx    Stomach cancer Neg Hx    Rectal cancer Neg Hx     Social History   Tobacco Use   Smoking status: Former    Types: Cigarettes   Smokeless tobacco: Never   Tobacco comments:       Vaping Use   Vaping Use: Never used  Substance Use Topics   Alcohol use: No   Drug use: No    ROS   Objective:   Vitals: BP (!) 167/106    Pulse (!) 50    Temp 98.6 F (37 C)    Resp 20    LMP 09/27/2014 (Approximate)    SpO2 99%   Physical Exam Constitutional:      General: She is not in acute distress.    Appearance: Normal appearance. She is well-developed. She is not ill-appearing, toxic-appearing or diaphoretic.  HENT:     Head: Normocephalic and atraumatic.     Nose: Nose normal.     Mouth/Throat:     Mouth: Mucous membranes are moist.  Eyes:  General: No scleral icterus.       Right eye: No discharge.        Left eye: No discharge.     Extraocular Movements: Extraocular movements intact.  Cardiovascular:     Rate and Rhythm: Normal rate.  Pulmonary:     Effort: Pulmonary effort is normal.  Skin:    General: Skin is warm and dry.  Neurological:     General: No focal deficit present.     Mental Status: She is alert and oriented to person, place, and time.  Psychiatric:        Mood and Affect: Mood normal.        Behavior: Behavior normal.        Thought Content: Thought content normal.        Judgment: Judgment normal.   DG Clavicle Left  Result Date: 10/04/2021 CLINICAL DATA:  Clavicular pain for 2 weeks. EXAM: LEFT CLAVICLE - 2+ VIEWS COMPARISON:  None. FINDINGS: AC joint  degenerative changes on the left. No fracture or dislocation identified in the clavicle, scapula, or proximal humerus. IMPRESSION: AC joint degenerative changes.  No other abnormalities. Electronically Signed   By: Dorise Bullion III M.D.   On: 10/04/2021 10:47    Narrative  Clinical Data:  Right arm pain, weakness and numbness for four weeks.    MRI CERVICAL SPINE WITHOUT CONTRAST:  Technique:  Multiplanar and multiecho pulse sequences of the cervical spine, to include the base of the skull and upper thoracic region, were obtained according to standard protocol without IV contrast.  Comparison:  None.   Correlative studies - digitized films from Dr. Tanna Furry office from 06/07/05.  Findings:  Mild reversal of the normal cervical lordotic curve.  No subluxation.  Marrow signal homogeneous.  Craniocervical junction unremarkable.  Multiple areas of cord compression.  Borderline abnormal cord signal particularly at C3-4 and C5-6.    C3-4:   Significant central canal stenosis due to osteophyte formation and central bulging annular fibers.  Superimposed bilateral uncinate hypertrophy, right greater than left with bilateral C4 nerve root encroachment.  Canal diameter 8 mm.  C4-5:   Central osteophyte formation and central bulging annular fibers along with asymmetric uncinate hypertrophy on the left.  Left C5 nerve root encroachment is present.  Mild cord flattening is seen with canal diameter 8 mm.    C5-6:   Central canal stenosis  with mild cord flattening, central osteophyte formation, and central bulging annular fibers.  Bilateral uncinate hypertrophy results in bilateral C6 nerve root encroachment.   Canal diameter 8 mm.  C6-7:   Soft disk protrusion central and to the left along with mild central bulging annular fibers.  Mild left sided cord flattening measuring 7 to 8 mm.  Left C7 nerve root encroachment is present.  C7-T1:   Normal interspace.  T1-2:   Large left sided spur.  Probable left T1 nerve root  encroachment.     The predominant areas of nerve root encroachment are seen on the left particularly at C5-6 and C6-7.  With regard to the patient's neck and right arm pain, C3-4 would appear to be the most likely level to be symptomatic.  IMPRESSION:  1.   Multilevel spondylosis from C3 through C7 with significant cord flattening and canal stenosis; canal diameters range between 7 and 8 mm are associated with borderline abnormal cord signal.  2.   Bilateral uncinate hypertrophy C3-4 right greater than left appears to compromise the exiting C4 nerve roots worse on  the right.  3.   Soft disk protrusion C6-7 on the left compresses the left C7 nerve root.  4.   Asymmetric uncinate hypertrophy at C4-5, left as well as bilaterally at C5-6 compress the exiting left C5 and bilateral C6 nerve roots respectively.  5.   Large spur T1-2, left with probable left T1 nerve root compromise.                     Provider: Lavonia Dana   ED ECG REPORT   Date: 10/04/2021  Rate: 43bpm  Rhythm: sinus bradycardia  QRS Axis: normal  Intervals: normal  ST/T Wave abnormalities: normal  Conduction Disutrbances:none  Narrative Interpretation: Sinus bradycardia at 43 bpm, first-degree AV block.  Largely unchanged from previous EKGs.  Old EKG Reviewed: unchanged  I have personally reviewed the EKG tracing and agree with the computerized printout as noted.  Assessment and Plan :   PDMP not reviewed this encounter.  1. Cervical disc disorder with radiculopathy of cervical region   2. Essential hypertension   3. Cervical spondylosis   4. Foraminal stenosis of cervical region   5. Nonspecific abnormal electrocardiogram (ECG) (EKG)   6. Bradycardia   7. Pain of left clavicle    Recommended an oral prednisone course as I suspect that her pain is coming from the cervical and thoracic region given her history of spondylosis, foraminal stenosis, bulging disks.  Recommended she follow-up with a spine specialist.   She also has signs of a first-degree AV block, recommended follow-up with a cardiologist. Counseled patient on potential for adverse effects with medications prescribed/recommended today, ER and return-to-clinic precautions discussed, patient verbalized understanding.    Jaynee Eagles, PA-C 10/04/21 1112

## 2021-10-14 ENCOUNTER — Emergency Department (HOSPITAL_BASED_OUTPATIENT_CLINIC_OR_DEPARTMENT_OTHER)
Admission: EM | Admit: 2021-10-14 | Discharge: 2021-10-14 | Disposition: A | Discharge status: Home / Self Care | Payer: No Typology Code available for payment source | Attending: Emergency Medicine | Admitting: Emergency Medicine

## 2021-10-14 ENCOUNTER — Other Ambulatory Visit: Payer: Self-pay

## 2021-10-14 ENCOUNTER — Encounter (HOSPITAL_BASED_OUTPATIENT_CLINIC_OR_DEPARTMENT_OTHER): Payer: Self-pay

## 2021-10-14 DIAGNOSIS — S8010XA Contusion of unspecified lower leg, initial encounter: Secondary | ICD-10-CM

## 2021-10-14 DIAGNOSIS — Y92410 Unspecified street and highway as the place of occurrence of the external cause: Secondary | ICD-10-CM | POA: Insufficient documentation

## 2021-10-14 DIAGNOSIS — I10 Essential (primary) hypertension: Secondary | ICD-10-CM | POA: Diagnosis not present

## 2021-10-14 DIAGNOSIS — S8000XA Contusion of unspecified knee, initial encounter: Secondary | ICD-10-CM | POA: Diagnosis not present

## 2021-10-14 DIAGNOSIS — S139XXA Sprain of joints and ligaments of unspecified parts of neck, initial encounter: Secondary | ICD-10-CM

## 2021-10-14 DIAGNOSIS — Z79899 Other long term (current) drug therapy: Secondary | ICD-10-CM | POA: Diagnosis not present

## 2021-10-14 DIAGNOSIS — S1090XA Unspecified superficial injury of unspecified part of neck, initial encounter: Secondary | ICD-10-CM | POA: Diagnosis present

## 2021-10-14 MED ORDER — IBUPROFEN 600 MG PO TABS: 600.0000 mg | ORAL_TABLET | Freq: Four times a day (QID) | ORAL | 0 refills | Status: DC | PRN

## 2021-10-14 MED ORDER — IBUPROFEN 200 MG PO TABS
600.0000 mg | ORAL_TABLET | Freq: Once | ORAL | Status: AC
  Administered 2021-10-14: 600 mg via ORAL
  Filled 2021-10-14: qty 1

## 2021-10-14 MED ORDER — METHOCARBAMOL 500 MG PO TABS: 500.0000 mg | ORAL_TABLET | Freq: Two times a day (BID) | ORAL | 0 refills | Status: DC

## 2021-10-14 MED ORDER — METHOCARBAMOL 500 MG PO TABS
500.0000 mg | ORAL_TABLET | Freq: Once | ORAL | Status: AC
  Administered 2021-10-14: 500 mg via ORAL
  Filled 2021-10-14: qty 1
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
## 2021-10-21 ENCOUNTER — Other Ambulatory Visit: Payer: Self-pay

## 2021-10-21 ENCOUNTER — Encounter: Payer: Self-pay | Admitting: Family Medicine

## 2021-10-21 ENCOUNTER — Ambulatory Visit (INDEPENDENT_AMBULATORY_CARE_PROVIDER_SITE_OTHER): Payer: 59 | Admitting: Family Medicine

## 2021-10-21 VITALS — BP 166/85 | HR 51 | Ht 66.0 in | Wt 194.4 lb

## 2021-10-21 DIAGNOSIS — M542 Cervicalgia: Secondary | ICD-10-CM | POA: Diagnosis not present

## 2021-10-21 DIAGNOSIS — L282 Other prurigo: Secondary | ICD-10-CM | POA: Diagnosis not present

## 2021-10-21 DIAGNOSIS — I10 Essential (primary) hypertension: Secondary | ICD-10-CM | POA: Diagnosis not present

## 2021-10-21 DIAGNOSIS — R7303 Prediabetes: Secondary | ICD-10-CM

## 2021-10-21 LAB — POCT GLYCOSYLATED HEMOGLOBIN (HGB A1C): HbA1c, POC (controlled diabetic range): 6.1 % (ref 0.0–7.0)

## 2021-10-21 MED ORDER — BACLOFEN 10 MG PO TABS: 10.0000 mg | ORAL_TABLET | Freq: Three times a day (TID) | ORAL | 0 refills | Status: DC

## 2021-10-21 MED ORDER — CETIRIZINE HCL 10 MG PO TABS: 10.0000 mg | ORAL_TABLET | Freq: Every day | ORAL | 11 refills | Status: DC

## 2021-10-21 NOTE — Assessment & Plan Note (Signed)
Elevated in office at 166/85. Inconsistently taking Amlodipine 5mg  daily. Patient to check BP twice daily, if elevated >140 persistently in 3 days then increase to 10mg  daily. Patient to continue twice daily BP checks until follow-up visit in 2 weeks.  - Twice daily BP monitoring - Amlodipine adjustment as above - BMP today - Consider addition of losartan if elevated after increasing amlodipine.
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
## 2021-10-21 NOTE — Patient Instructions (Signed)
It was so great seeing you today! Today we discussed the following:  -For your blood pressure, I want you to check it at least twice a day for the next 3 to 4 days.  If your blood pressure is consistently >140, I want you to increase your amlodipine to 10 mg daily (2 tablets).  You will continue to take your blood pressure twice daily and follow back up in our clinic in 2 weeks.  Please make sure to bring your blood pressure log with you.  -For your neck pain, I am getting x-rays.  I am also sending in a medication called baclofen, which is a muscle relaxant and you will completely stop the Robaxin.  Continue taking ibuprofen and Tylenol as needed.  You can also use heating pads to see if that helps relax your muscles as well.  -For your rash, I want you to start taking Zyrtec daily, you can also use a Benadryl cream to see if that helps stop the itching.  If the rash is not improving over the next couple of days then make an appointment even sooner than the 2 weeks.   Please make sure to bring any medications you take to your appointments. If you have any questions or concerns please call the office at [phone].   If any tests were collected today, I will notify you of results via MyChart, phone call, or letter. Please contact the office if you have not heard back about results within 2 weeks.

## 2021-10-21 NOTE — Assessment & Plan Note (Signed)
A1c stable at 6.1%. Currently diet controlled. Not taking statin medication.  - Lipid panel today

## 2021-10-22 ENCOUNTER — Telehealth: Payer: Self-pay | Admitting: Family Medicine

## 2021-10-22 ENCOUNTER — Ambulatory Visit
Admission: RE | Admit: 2021-10-22 | Discharge: 2021-10-22 | Disposition: A | Discharge status: Home / Self Care | Payer: Self-pay | Source: Ambulatory Visit | Attending: Family Medicine | Admitting: Family Medicine

## 2021-10-22 DIAGNOSIS — M542 Cervicalgia: Secondary | ICD-10-CM

## 2021-10-22 LAB — BASIC METABOLIC PANEL
BUN/Creatinine Ratio: 14 (ref 9–23)
BUN: 10 mg/dL (ref 6–24)
CO2: 24 mmol/L (ref 20–29)
Calcium: 9.7 mg/dL (ref 8.7–10.2)
Chloride: 107 mmol/L — ABNORMAL HIGH (ref 96–106)
Creatinine, Ser: 0.73 mg/dL (ref 0.57–1.00)
Glucose: 100 mg/dL — ABNORMAL HIGH (ref 70–99)
Potassium: 4.3 mmol/L (ref 3.5–5.2)
Sodium: 143 mmol/L (ref 134–144)
eGFR: 96 mL/min/{1.73_m2} (ref >59)

## 2021-10-22 LAB — LIPID PANEL
Chol/HDL Ratio: 5.3 ratio — ABNORMAL HIGH (ref 0.0–4.4)
Cholesterol, Total: 273 mg/dL — ABNORMAL HIGH (ref 100–199)
HDL: 52 mg/dL (ref >39)
LDL Chol Calc (NIH): 203 mg/dL — ABNORMAL HIGH (ref 0–99)
Triglycerides: 101 mg/dL (ref 0–149)
VLDL Cholesterol Cal: 18 mg/dL (ref 5–40)

## 2021-10-22 MED ORDER — ROSUVASTATIN CALCIUM 20 MG PO TABS: 20.0000 mg | ORAL_TABLET | Freq: Every day | ORAL | 3 refills | Status: DC

## 2021-10-22 NOTE — Telephone Encounter (Signed)
Called patient with results of lipid panel and BMP. Discussed with patient that she will need to restart a cholesterol medication. She was previously on Simvastatin, but this is not a high intensity statin. Had previous intolerance with Lipitor, so we will trial Rosuvastatin at this time. If she does not tolerate this, we will attempt decreasing dose and then gradually increasing. If necessary, patient can continue on Simvastatin (though not ideal).   The 10-year ASCVD risk score (Arnett DK, et al., 2019) is: 36.7%   Values used to calculate the score:     Age: 56 years     Sex: Female     Is Non-Hispanic African American: Yes     Diabetic: Yes     Tobacco smoker: No     Systolic Blood Pressure: 250 mmHg     Is BP treated: Yes     HDL Cholesterol: 52 mg/dL     Total Cholesterol: 273 mg/dL      Evalise Abruzzese, DO

## 2021-11-02 ENCOUNTER — Other Ambulatory Visit: Payer: Self-pay | Admitting: Family Medicine

## 2021-11-02 ENCOUNTER — Telehealth: Payer: Self-pay | Admitting: *Deleted

## 2021-11-02 DIAGNOSIS — M542 Cervicalgia: Secondary | ICD-10-CM

## 2021-11-02 NOTE — Telephone Encounter (Signed)
Patient is requesting a referral to see physical therapy for her neck pain.  Will forward to MD to place this.  Cassandra Osborne,CMA

## 2021-11-04 ENCOUNTER — Ambulatory Visit: Payer: No Typology Code available for payment source | Attending: Family Medicine | Admitting: Physical Therapy

## 2021-11-04 ENCOUNTER — Other Ambulatory Visit: Payer: Self-pay

## 2021-11-04 ENCOUNTER — Encounter: Payer: Self-pay | Admitting: Physical Therapy

## 2021-11-04 DIAGNOSIS — M542 Cervicalgia: Secondary | ICD-10-CM | POA: Diagnosis present
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
## 2021-11-04 NOTE — Patient Instructions (Signed)
Access Code: [email or web address] URL: [email or web address] Date: 11/04/2021 Prepared by: Hilda Blades  Exercises Supine Cervical Retraction with Towel - 2-3 x daily - 10 reps - 5 seconds hold Seated Scapular Retraction - 2-3 x daily - 10 reps - 5 seconds hold Gentle Upper Trap Stretch - 2-3 x daily - 3 reps - 15 seconds hold

## 2021-11-10 ENCOUNTER — Encounter: Payer: Self-pay | Admitting: Family Medicine

## 2021-11-10 MED ORDER — AMLODIPINE BESYLATE 5 MG PO TABS: 5.0000 mg | ORAL_TABLET | Freq: Every day | ORAL | 3 refills | Status: DC

## 2021-11-11 NOTE — Therapy (Signed)
OUTPATIENT PHYSICAL THERAPY TREATMENT NOTE   Patient Name: Cassandra Osborne MRN: [id number] DOB:Jul 27, 1966, 56 y.o., female Today's Date: 11/12/2021  PCP: Rise Patience, DO REFERRING PROVIDER: Rise Patience, DO   PT End of Session - 11/12/21 0846     Visit Number 2    Number of Visits 8    Date for PT Re-Evaluation 12/30/21    Authorization Type Worker's Comp    PT Start Time [phone]   patient not checked in   PT Stop Time 0915    PT Time Calculation (min) 32 min    Activity Tolerance Patient tolerated treatment well    Behavior During Therapy [facility] for tasks assessed/performed             Past Medical History:  Diagnosis Date   High cholesterol    Hypertension    No pertinent past medical history    Past Surgical History:  Procedure Laterality Date   LUMBAR LAMINECTOMY/DECOMPRESSION MICRODISCECTOMY  09/18/2012   Procedure: LUMBAR LAMINECTOMY/DECOMPRESSION MICRODISCECTOMY 1 LEVEL;  Surgeon: Winfield Cunas, MD;  Location: [facility];  Service: Neurosurgery;  Laterality: Left;  Left Lumbar Four-Five Diskectomy   Patient Active Problem List   Diagnosis Date Noted   Foot pain 11/04/2020   Acute right-sided low back pain without sciatica 07/29/2020   Neck pain 02/04/2020   GERD (gastroesophageal reflux disease) 02/04/2020   Anxiety attack 11/02/2019   Vitamin D deficiency 09/28/2019   Balance problem 06/15/2019   HTN (hypertension) 10/04/2018   Dyspepsia 11/25/2017   Hyperlipidemia 12/19/2015   Prediabetes 12/19/2015    REFERRING PROVIDER: Rise Patience, DO   REFERRING DIAG: Neck pain  THERAPY DIAG:  Cervicalgia  PERTINENT HISTORY: None  PRECAUTIONS: None  SUBJECTIVE: Patient reports she is doing well and consistent with her exercises.   PAIN:  Are you having pain? Yes NPRS scale: 7/10 Pain location: Neck Pain orientation: Midline, left PAIN TYPE: Acute Pain description: Constant, aggravating Aggravating factors: Moving the neck, sitting or  looking at phone/computer, sleeping Relieving factors: Medication, heating pad  PATIENT GOALS: Strengthen neck, pain relief, exercises   OBJECTIVE:  PATIENT SURVEYS:  FOTO 46% functional status    POSTURE:  Slight rounded shoulder and forward head posture   PALPATION: Tender to palpation bilateral upper trap and cervical paraspinal musculature            CERVICAL AROM/PROM   AROM AROM (deg) 11/04/2021  11/12/2021  Flexion 35   Extension 40   Right lateral flexion 25   Left lateral flexion 25   Right rotation 60 65  Left rotation 65 70   patient reports continued pulling with cervical rotation   UE MMT:          Shoulder strength grossly 5/5 MMT Periscapular strength grossly 4-/5 MMT with upper trap compensation   FUNCTIONAL TESTS:  DNF endurance test: 9 seconds    TODAY'S TREATMENT:   11/04/2021  Therapeutic Exercise: UBE L2 x 4 min (2 fwd/bwd) while taking subjective Supine cervical retraction with towel roll under neck 2 x 10 with 5 sec Supine serratus press 2 x 10 Supine horizontal abduction with red 2 x 10 Sidelying thoracic rotation x 10 each Row with green 2 x 10 Manual: Suboccipital release with gentle manual traction x 2 bouts Passive upper trap and levator scap stretch   11/04/2021 (evaluation)  Therapeutic Exercise: Supine cervical retraction with towel roll under neck 5 x 5 sec Seated shoulder blade squeezes 5 x 5 sec Seated upper trap stretch  2 x 15 sec each   PATIENT EDUCATION:  Education details: HEP Person educated: Patient Education method: Explanation, Demonstration, Tactile cues, Verbal cues, and Handouts Education comprehension: verbalized understanding, returned demonstration, verbal cues required, tactile cues required, and needs further education   HOME EXERCISE PROGRAM: Access Code: [email or web address]     ASSESSMENT: CLINICAL IMPRESSION: Patient tolerated therapy well with no adverse effects. Therapy focused reducing muscle tension and  tightness of the cervical region and progressing postural strength and control. Patient able to tolerate progression of resistance exercises and did not report any increase pain with therapy. She did require cueing for proper cervical retraction to avoid over activation of anterior neck and shoulder muscles. Patient would benefit from continued skilled PT to progress mobility and strength in order to reduce pain and maximize her functional ability.  Objective impairments include decreased activity tolerance, decreased ROM, decreased strength, impaired flexibility, postural dysfunction, and pain.     GOALS: Goals reviewed with patient? Yes   SHORT TERM GOALS:   STG Name Target Date Goal status  1 Patient will be I with initial HEP in order to progress with therapy. Baseline: HEP provided at eval 12/02/2021 INITIAL  2 PT will review FOTO with patient by 3rd visit in order to understand expected progress and outcome with therapy. Baseline: FOTO assessed at eval 12/02/2021 INITIAL  3 Patient will report pain level </= 5/10 in order to reduce functional limitations Baseline: 8/10 pain at eval 12/02/2021 INITIAL    LONG TERM GOALS:    LTG Name Target Date Goal status  1 Patient will be I with final HEP to maintain progress from PT. Baseline: HEP provided at eval 12/30/2021 INITIAL  2 Patient will report >/= 66% status on FOTO to indicate improved functional ability. Baseline: 46% functional status 12/30/2021 INITIAL  3 Patient will demonstrate cervical rotation >/= 70 deg bilaterally in order to improve driving ability Baseline: 60-65 deg cervical rotation (see above) 12/30/2021 INITIAL  4 Patient will exhibit DNF endurance test >/= 30 sec to improve postural control and reduce pain with sitting Baseline: DNF endurance test 9 seconds 12/30/2021 INITIAL  5 Patient will report pain </= 3/10 in order to reduce functional limitation and return to prior level of activity Baseline: 8/10 at eval 12/30/2021 INITIAL     PLAN: PT FREQUENCY: 1x/week   PT DURATION: 8 weeks   PLANNED INTERVENTIONS: Therapeutic exercises, Therapeutic activity, Neuro Muscular re-education, Balance training, Gait training, Patient/Family education, Joint mobilization, Aquatic Therapy, Dry Needling, Electrical stimulation, Spinal mobilization, Cryotherapy, Moist heat, Taping, and Manual therapy   PLAN FOR NEXT SESSION: Review HEP and progress PRN, manual/dry needling for upper trap and cervical musculature, cervical mobs and thoracic mobility, DNF endurance and postural strengthening    Hilda Blades, PT, DPT, LAT, ATC 11/12/21  9:17 AM Phone: [phone] Fax: [phone]

## 2021-11-12 ENCOUNTER — Ambulatory Visit: Payer: No Typology Code available for payment source | Attending: Family Medicine | Admitting: Physical Therapy

## 2021-11-12 ENCOUNTER — Other Ambulatory Visit: Payer: Self-pay

## 2021-11-12 ENCOUNTER — Encounter: Payer: Self-pay | Admitting: Physical Therapy

## 2021-11-12 DIAGNOSIS — M542 Cervicalgia: Secondary | ICD-10-CM | POA: Insufficient documentation

## 2021-11-15 DIAGNOSIS — M542 Cervicalgia: Secondary | ICD-10-CM | POA: Diagnosis present

## 2021-11-16 NOTE — Therapy (Signed)
OUTPATIENT PHYSICAL THERAPY TREATMENT NOTE   Patient Name: Cassandra Osborne MRN: [id number] DOB:09/11/1966, 56 y.o., female Today's Date: 11/17/2021  PCP: Rise Patience, DO REFERRING PROVIDER: Rise Patience, DO   PT End of Session - 11/17/21 [phone]     Visit Number 3    Number of Visits 8    Date for PT Re-Evaluation 12/30/21    Authorization Type Worker's Comp    PT Start Time 0830    PT Stop Time 0915    PT Time Calculation (min) 45 min    Activity Tolerance Patient tolerated treatment well    Behavior During Therapy WFL for tasks assessed/performed              Past Medical History:  Diagnosis Date   High cholesterol    Hypertension    No pertinent past medical history    Past Surgical History:  Procedure Laterality Date   LUMBAR LAMINECTOMY/DECOMPRESSION MICRODISCECTOMY  09/18/2012   Procedure: LUMBAR LAMINECTOMY/DECOMPRESSION MICRODISCECTOMY 1 LEVEL;  Surgeon: Winfield Cunas, MD;  Location: [facility];  Service: Neurosurgery;  Laterality: Left;  Left Lumbar Four-Five Diskectomy   Patient Active Problem List   Diagnosis Date Noted   Foot pain 11/04/2020   Acute right-sided low back pain without sciatica 07/29/2020   Neck pain 02/04/2020   GERD (gastroesophageal reflux disease) 02/04/2020   Anxiety attack 11/02/2019   Vitamin D deficiency 09/28/2019   Balance problem 06/15/2019   HTN (hypertension) 10/04/2018   Dyspepsia 11/25/2017   Hyperlipidemia 12/19/2015   Prediabetes 12/19/2015    REFERRING PROVIDER: Rise Patience, DO   REFERRING DIAG: Neck pain  THERAPY DIAG:  Cervicalgia  PERTINENT HISTORY: None  PRECAUTIONS: None  SUBJECTIVE: Patient reports that she is dealing with a blocked nostril this morning. States still having tightness of her neck.  PAIN:  Are you having pain? Yes NPRS scale: 6/10 Pain location: Neck Pain orientation: Midline, left PAIN TYPE: Acute Pain description: Constant, aggravating Aggravating factors: Moving the  neck, sitting or looking at phone/computer, sleeping Relieving factors: Medication, heating pad  PATIENT GOALS: Strengthen neck, pain relief, exercises   OBJECTIVE: (BOLDED MEASURES ASSESSED THIS VISIT) PATIENT SURVEYS:  FOTO 46% functional status    POSTURE:  Slight rounded shoulder and forward head posture   PALPATION: Tender to palpation bilateral upper trap and cervical paraspinal musculature            CERVICAL AROM/PROM   AROM AROM (deg) 11/04/2021  11/12/2021  Flexion 35   Extension 40   Right lateral flexion 25   Left lateral flexion 25   Right rotation 60 65  Left rotation 65 70   patient reports continued pulling with cervical rotation   UE MMT:          Shoulder strength grossly 5/5 MMT Periscapular strength grossly 4-/5 MMT with upper trap compensation   FUNCTIONAL TESTS:  DNF endurance test: 9 seconds    TODAY'S TREATMENT:   11/17/2021: Therapeutic Exercise: UBE L2 x 4 min (2 fwd/bwd) while taking subjective Upper trap stretch 2 x 15 sec each Seated cervical retraction 2 x 10 with 3 sec hold Sidelying thoracic rotation x 10 each Supine serratus press with overhead reach x 5 Row with green 2 x 10 Manual: Suboccipital release with gentle manual traction x 3 bouts STM for bilat upper trap region Passive upper trap and levator scap stretch Trigger Point Dry Needling Treatment: Pre-treatment instruction: Patient instructed on dry needling rationale, procedures, and possible side effects including pain during  treatment (achy,cramping feeling), bruising, drop of blood, lightheadedness, nausea, sweating. Patient Consent Given: Yes Education handout provided: No Muscles treated: left upper trap  Needle size and number: .25x75mm x 1 Electrical stimulation performed: No Parameters: N/A Treatment response/outcome: Twitch response elicited, patient reported she did not like the feeling stating pain so discontinued treatment after single treatment Post-treatment  instructions: Patient instructed to expect possible mild to moderate muscle soreness later today and/or tomorrow. Patient instructed in methods to reduce muscle soreness and to continue prescribed HEP. If patient was dry needled over the lung field, patient was instructed on signs and symptoms of pneumothorax and, however unlikely, to see immediate medical attention should they occur. Patient was also educated on signs and symptoms of infection and to seek medical attention should they occur. Patient verbalized understanding of these instructions and education.   11/12/2021:  Therapeutic Exercise: UBE L2 x 4 min (2 fwd/bwd) while taking subjective Supine cervical retraction with towel roll under neck 2 x 10 with 5 sec Supine serratus press 2 x 10 Supine horizontal abduction with red 2 x 10 Sidelying thoracic rotation x 10 each Row with green 2 x 10 Manual: Suboccipital release with gentle manual traction x 2 bouts Passive upper trap and levator scap stretch  11/04/2021 (evaluation)  Therapeutic Exercise: Supine cervical retraction with towel roll under neck 5 x 5 sec Seated shoulder blade squeezes 5 x 5 sec Seated upper trap stretch 2 x 15 sec each   PATIENT EDUCATION:  Education details: HEP update Person educated: Patient Education method: Explanation, Demonstration, Tactile cues, Verbal cues, and Handouts Education comprehension: verbalized understanding, returned demonstration, verbal cues required, tactile cues required, and needs further education   HOME EXERCISE PROGRAM: Access Code: [email or web address]     ASSESSMENT: CLINICAL IMPRESSION: Patient tolerated therapy well with no adverse effects. Trial of trigger point dry needling this visit but discontinued due to patient reporting increase in pain. Therapy continued to focus on reducing tension of bilat upper trap and suboccipital regions. She continues to report tightness and tension as her main limitations. Updated HEP this visit to  incorporate thoracic mobility. Patient would benefit from continued skilled PT to progress mobility and strength in order to reduce pain and maximize her functional ability.  Objective impairments include decreased activity tolerance, decreased ROM, decreased strength, impaired flexibility, postural dysfunction, and pain.     GOALS: Goals reviewed with patient? Yes   SHORT TERM GOALS:   STG Name Target Date Goal status  1 Patient will be I with initial HEP in order to progress with therapy. Baseline: HEP provided at eval 12/02/2021 INITIAL  2 PT will review FOTO with patient by 3rd visit in order to understand expected progress and outcome with therapy. Baseline: FOTO assessed at eval 12/02/2021 INITIAL  3 Patient will report pain level </= 5/10 in order to reduce functional limitations Baseline: 8/10 pain at eval 12/02/2021 INITIAL    LONG TERM GOALS:    LTG Name Target Date Goal status  1 Patient will be I with final HEP to maintain progress from PT. Baseline: HEP provided at eval 12/30/2021 INITIAL  2 Patient will report >/= 66% status on FOTO to indicate improved functional ability. Baseline: 46% functional status 12/30/2021 INITIAL  3 Patient will demonstrate cervical rotation >/= 70 deg bilaterally in order to improve driving ability Baseline: 60-65 deg cervical rotation (see above) 12/30/2021 INITIAL  4 Patient will exhibit DNF endurance test >/= 30 sec to improve postural control and reduce pain with sitting  Baseline: DNF endurance test 9 seconds 12/30/2021 INITIAL  5 Patient will report pain </= 3/10 in order to reduce functional limitation and return to prior level of activity Baseline: 8/10 at eval 12/30/2021 INITIAL     PLAN: PT FREQUENCY: 1x/week   PT DURATION: 8 weeks   PLANNED INTERVENTIONS: Therapeutic exercises, Therapeutic activity, Neuro Muscular re-education, Balance training, Gait training, Patient/Family education, Joint mobilization, Aquatic Therapy, Dry Needling,  Electrical stimulation, Spinal mobilization, Cryotherapy, Moist heat, Taping, and Manual therapy   PLAN FOR NEXT SESSION: Review HEP and progress PRN, manual/dry needling for upper trap and cervical musculature, cervical mobs and thoracic mobility, DNF endurance and postural strengthening    Hilda Blades, PT, DPT, LAT, ATC 11/17/21  9:18 AM Phone: [phone] Fax: [phone]

## 2021-11-17 ENCOUNTER — Other Ambulatory Visit: Payer: Self-pay

## 2021-11-17 ENCOUNTER — Ambulatory Visit: Payer: No Typology Code available for payment source | Admitting: Physical Therapy

## 2021-11-17 ENCOUNTER — Encounter: Payer: Self-pay | Admitting: Physical Therapy

## 2021-11-17 DIAGNOSIS — M542 Cervicalgia: Secondary | ICD-10-CM | POA: Diagnosis not present

## 2021-11-17 NOTE — Patient Instructions (Signed)
Access Code: [email or web address] URL: [email or web address] Date: 11/17/2021 Prepared by: Hilda Blades  Exercises Supine Cervical Retraction with Towel - 2-3 x daily - 10 reps - 5 seconds hold Seated Scapular Retraction - 2-3 x daily - 10 reps - 5 seconds hold Gentle Upper Trap Stretch - 2-3 x daily - 3 reps - 15 seconds hold Sidelying Thoracic Lumbar Rotation - 2-3 x daily - 10 reps - seconds5 hold

## 2021-12-21 ENCOUNTER — Ambulatory Visit: Payer: No Typology Code available for payment source | Attending: Family Medicine

## 2021-12-21 ENCOUNTER — Other Ambulatory Visit: Payer: Self-pay

## 2021-12-21 DIAGNOSIS — M542 Cervicalgia: Secondary | ICD-10-CM | POA: Insufficient documentation

## 2021-12-21 NOTE — Therapy (Addendum)
?OUTPATIENT PHYSICAL THERAPY TREATMENT NOTE ? ?DISCHARGE ? ? ?Patient Name: Cassandra Osborne ?MRN: [id number] ?DOB:05-Mar-1966, 56 y.o., female ?Today's Date: 12/21/2021 ? ?PCP: Rise Patience, DO ?REFERRING PROVIDER: Rise Patience, DO ? ? PT End of Session - 12/21/21 1018   ? ? Visit Number 4   ? Number of Visits 8   ? Date for PT Re-Evaluation 12/30/21   ? Authorization Type Worker's Comp   ? PT Start Time 1018   ? PT Stop Time 1059   ? PT Time Calculation (min) 41 min   ? Activity Tolerance Patient tolerated treatment well   ? Behavior During Therapy [facility] for tasks assessed/performed   ? ?  ?  ? ?  ? ? ? ?Past Medical History:  ?Diagnosis Date  ? High cholesterol   ? Hypertension   ? No pertinent past medical history   ? ?Past Surgical History:  ?Procedure Laterality Date  ? LUMBAR LAMINECTOMY/DECOMPRESSION MICRODISCECTOMY  09/18/2012  ? Procedure: LUMBAR LAMINECTOMY/DECOMPRESSION MICRODISCECTOMY 1 LEVEL;  Surgeon: Winfield Cunas, MD;  Location: [place] [facility];  Service: Neurosurgery;  Laterality: Left;  Left Lumbar Four-Five Diskectomy  ? ?Patient Active Problem List  ? Diagnosis Date Noted  ? Foot pain 11/04/2020  ? Acute right-sided low back pain without sciatica 07/29/2020  ? Neck pain 02/04/2020  ? GERD (gastroesophageal reflux disease) 02/04/2020  ? Anxiety attack 11/02/2019  ? Vitamin D deficiency 09/28/2019  ? Balance problem 06/15/2019  ? HTN (hypertension) 10/04/2018  ? Dyspepsia 11/25/2017  ? Hyperlipidemia 12/19/2015  ? Prediabetes 12/19/2015  ? ? ?REFERRING PROVIDER: Rise Patience, DO ?  ?REFERRING DIAG: Neck pain ? ?THERAPY DIAG:  ?Cervicalgia ? ?PERTINENT HISTORY: None ? ?PRECAUTIONS: None ? ?SUBJECTIVE: "I've been feeling alright."  ? ?PAIN:  ?Are you having pain? No ?NPRS scale: 0/10 ?NPRS worst scale: 8/10  ?Pain location: Neck ?Pain orientation: Midline, left ?Pain description: intermittent, aggravating ?Aggravating factors: Moving the neck, sitting or looking at phone/computer,  sleeping ?Relieving factors: Medication, heating pad ? ?PATIENT GOALS: Strengthen neck, pain relief, exercises ? ? ?OBJECTIVE: (BOLDED MEASURES ASSESSED THIS VISIT) ?PATIENT SURVEYS:  ?FOTO 46% functional status ?   ?POSTURE:  ?Slight rounded shoulder and forward head posture ?  ?PALPATION: ?Tender to palpation bilateral upper trap and cervical paraspinal musculature          ?  ?CERVICAL AROM/PROM ?  ?AROM AROM (deg) ?11/04/2021  ?11/12/2021 12/21/21  ?Flexion 35  25  ?Extension 40  51  ?Right lateral flexion 25    ?Left lateral flexion 25    ?Right rotation 60 65   ?Left rotation 65 70   ? patient reports continued pulling with cervical rotation ?  ?UE MMT: ?         Shoulder strength grossly 5/5 MMT ?Periscapular strength grossly 4-/5 MMT with upper trap compensation ?  ?FUNCTIONAL TESTS:  ?DNF endurance test: 9 seconds ?  ? ?TODAY'S TREATMENT:  ?[facility] Adult PT Treatment:                                                DATE: 12/21/21 ?Therapeutic Exercise: ?UBE level 2 x 2 min each fwd/bwd  ?Prone scapular retraction 2 x 15  ?Bilateral shoulder ER 2 x 10 yellow band  ?Standing horizontal abduction 2 x 10 yellow band  ?Manual Therapy: ?STM bilateral upper/middle trap, levator scapulae, cervical paraspinals ?  Suboccipital release ?Cervical CPAs grade II ?Passive upper trap stretch  ?Issued tennis ball for self soft tissue mobilization  ? ?  ? ?11/17/2021: ?Therapeutic Exercise: ?UBE L2 x 4 min (2 fwd/bwd) while taking subjective ?Upper trap stretch 2 x 15 sec each ?Seated cervical retraction 2 x 10 with 3 sec hold ?Sidelying thoracic rotation x 10 each ?Supine serratus press with overhead reach x 5 ?Row with green 2 x 10 ?Manual: ?Suboccipital release with gentle manual traction x 3 bouts ?STM for bilat upper trap region ?Passive upper trap and levator scap stretch ?Trigger Point Dry Needling Treatment: ?Pre-treatment instruction: Patient instructed on dry needling rationale, procedures, and possible side effects including  pain during treatment (achy,cramping feeling), bruising, drop of blood, lightheadedness, nausea, sweating. ?Patient Consent Given: Yes ?Education handout provided: No ?Muscles treated: left upper trap  ?Needle size and number: .25x62m x 1 ?Electrical stimulation performed: No ?Parameters: N/A ?Treatment response/outcome: Twitch response elicited, patient reported she did not like the feeling stating pain so discontinued treatment after single treatment ?Post-treatment instructions: Patient instructed to expect possible mild to moderate muscle soreness later today and/or tomorrow. Patient instructed in methods to reduce muscle soreness and to continue prescribed HEP. If patient was dry needled over the lung field, patient was instructed on signs and symptoms of pneumothorax and, however unlikely, to see immediate medical attention should they occur. Patient was also educated on signs and symptoms of infection and to seek medical attention should they occur. Patient verbalized understanding of these instructions and education. ? ? ?  ?PATIENT EDUCATION:  ?Education details: see treatment  ?Person educated: patient  ?Education method: Explanation, Demonstration, verbal cues  ?Education comprehension: verbalized understanding, returned demonstration, verbal cues required ?  ?HOME EXERCISE PROGRAM: ?Access Code: [email or web address] ?  ?  ?ASSESSMENT: ?CLINICAL IMPRESSION: ?Fair tolerance to manual therapy as patient has difficulty relaxing musculature with gentle STM and gentle cervical joint mobilizations. She is noted to have trigger points about bilateral upper traps and levator scapulae, though does not tolerate trigger point release at this time and did not tolerate TPDN well at previous sessions. Tennis ball was issued for patient to begin self STM of cervical musculature to assist in reducing muscle tautness. Overall good tolerance to ther ex with patient requiring minimal postural cues.  ? ?Objective impairments include  decreased activity tolerance, decreased ROM, decreased strength, impaired flexibility, postural dysfunction, and pain. ?  ?  ?GOALS: ?Goals reviewed with patient? Yes ?  ?SHORT TERM GOALS: ?  ?STG Name Target Date Goal status  ?1 Patient will be I with initial HEP in order to progress with therapy. ?Baseline: HEP provided at eval 12/02/2021 INITIAL  ?2 PT will review FOTO with patient by 3rd visit in order to understand expected progress and outcome with therapy. ?Baseline: FOTO assessed at eval 12/02/2021 INITIAL  ?3 Patient will report pain level </= 5/10 in order to reduce functional limitations ?Baseline: 8/10 pain at eval 12/02/2021 INITIAL  ?  ?LONG TERM GOALS:  ?  ?LTG Name Target Date Goal status  ?1 Patient will be I with final HEP to maintain progress from PT. ?Baseline: HEP provided at eval 12/30/2021 INITIAL  ?2 Patient will report >/= 66% status on FOTO to indicate improved functional ability. ?Baseline: 46% functional status 12/30/2021 INITIAL  ?3 Patient will demonstrate cervical rotation >/= 70 deg bilaterally in order to improve driving ability ?Baseline: 60-65 deg cervical rotation (see above) 12/30/2021 INITIAL  ?4 Patient will exhibit DNF endurance test >/= 30 sec  to improve postural control and reduce pain with sitting ?Baseline: DNF endurance test 9 seconds 12/30/2021 INITIAL  ?5 Patient will report pain </= 3/10 in order to reduce functional limitation and return to prior level of activity ?Baseline: 8/10 at eval 12/30/2021 INITIAL  ? ?  ?PLAN: ?PT FREQUENCY: 1x/week ?  ?PT DURATION: 8 weeks ?  ?PLANNED INTERVENTIONS: Therapeutic exercises, Therapeutic activity, Neuro Muscular re-education, Balance training, Gait training, Patient/Family education, Joint mobilization, Aquatic Therapy, Dry Needling, Electrical stimulation, Spinal mobilization, Cryotherapy, Moist heat, Taping, and Manual therapy ?  ?PLAN FOR NEXT SESSION: Review HEP and progress PRN, manual/dry needling for upper trap and cervical musculature,  cervical mobs and thoracic mobility, DNF endurance and postural strengthening ? ? ?Gwendolyn Grant, PT, DPT, ATC ?12/21/21 10:59 AM ? ? ?PHYSICAL THERAPY DISCHARGE SUMMARY ? ?Visits from Start of Care: 4 ? ?Current functio

## 2021-12-30 ENCOUNTER — Ambulatory Visit: Payer: 59 | Admitting: Physical Therapy

## 2021-12-31 ENCOUNTER — Ambulatory Visit: Payer: 59 | Attending: Family Medicine

## 2021-12-31 NOTE — Therapy (Incomplete)
?OUTPATIENT PHYSICAL THERAPY TREATMENT NOTE ? ? ?Patient Name: Cassandra Osborne ?MRN: [id number] ?DOB:1965-11-09, 56 y.o., female ?Today's Date: 12/31/2021 ? ?PCP: Rise Patience, DO ?REFERRING PROVIDER: Rise Patience, DO ? ? ? ? ? ?Past Medical History:  ?Diagnosis Date  ? High cholesterol   ? Hypertension   ? No pertinent past medical history   ? ?Past Surgical History:  ?Procedure Laterality Date  ? LUMBAR LAMINECTOMY/DECOMPRESSION MICRODISCECTOMY  09/18/2012  ? Procedure: LUMBAR LAMINECTOMY/DECOMPRESSION MICRODISCECTOMY 1 LEVEL;  Surgeon: Winfield Cunas, MD;  Location: [place] [facility];  Service: Neurosurgery;  Laterality: Left;  Left Lumbar Four-Five Diskectomy  ? ?Patient Active Problem List  ? Diagnosis Date Noted  ? Foot pain 11/04/2020  ? Acute right-sided low back pain without sciatica 07/29/2020  ? Neck pain 02/04/2020  ? GERD (gastroesophageal reflux disease) 02/04/2020  ? Anxiety attack 11/02/2019  ? Vitamin D deficiency 09/28/2019  ? Balance problem 06/15/2019  ? HTN (hypertension) 10/04/2018  ? Dyspepsia 11/25/2017  ? Hyperlipidemia 12/19/2015  ? Prediabetes 12/19/2015  ? ? ?REFERRING PROVIDER: Rise Patience, DO ?  ?REFERRING DIAG: Neck pain ? ?THERAPY DIAG:  ?No diagnosis found. ? ?PERTINENT HISTORY: None ? ?PRECAUTIONS: None ? ?SUBJECTIVE: *** ?"I've been feeling alright."  ? ?PAIN:  ?Are you having pain? No ?NPRS scale: ***/10 ?NPRS worst scale: ***/10  ?Pain location: Neck ?Pain orientation: Midline, left ?Pain description: intermittent, aggravating ?Aggravating factors: Moving the neck, sitting or looking at phone/computer, sleeping ?Relieving factors: Medication, heating pad ? ?PATIENT GOALS: Strengthen neck, pain relief, exercises ? ? ?OBJECTIVE: (BOLDED MEASURES ASSESSED THIS VISIT) ?PATIENT SURVEYS:  ?FOTO 46% functional status ?   ?POSTURE:  ?Slight rounded shoulder and forward head posture ?  ?PALPATION: ?Tender to palpation bilateral upper trap and cervical paraspinal musculature          ?   ?CERVICAL AROM/PROM ?  ?AROM AROM (deg) ?11/04/2021  ?11/12/2021 12/21/21  ?Flexion 35  25  ?Extension 40  51  ?Right lateral flexion 25    ?Left lateral flexion 25    ?Right rotation 60 65   ?Left rotation 65 70   ? patient reports continued pulling with cervical rotation ?  ?UE MMT: ?         Shoulder strength grossly 5/5 MMT ?Periscapular strength grossly 4-/5 MMT with upper trap compensation ?  ?FUNCTIONAL TESTS:  ?DNF endurance test: 9 seconds ?  ? ?TODAY'S TREATMENT:  ?[facility] Adult PT Treatment:                                                DATE: 12/31/2021 ?Therapeutic Exercise: ?UBE level 2 x 3 min each fwd/bwd  ?Prone scapular retraction 2 x 15  ?Rows RTB ?Shoulder ext RTB ?Bilateral shoulder ER 2 x 10 yellow band  ?Standing horizontal abduction 2 x 10 yellow band  ?Standing diagonals x10 BIL YTB ?Seated upper trap stretch ?Seated levator scap stretch ?Manual Therapy: ?STM bilateral upper/middle trap, levator scapulae, cervical paraspinals ?Suboccipital release ?Passive upper trap stretch  ? ? ?[facility] Adult PT Treatment:                                                DATE: 12/21/21 ?Therapeutic Exercise: ?UBE level 2 x 2  min each fwd/bwd  ?Prone scapular retraction 2 x 15  ?Bilateral shoulder ER 2 x 10 yellow band  ?Standing horizontal abduction 2 x 10 yellow band  ?Manual Therapy: ?STM bilateral upper/middle trap, levator scapulae, cervical paraspinals ?Suboccipital release ?Cervical CPAs grade II ?Passive upper trap stretch  ?Issued tennis ball for self soft tissue mobilization  ? ?  ?11/17/2021: ?Therapeutic Exercise: ?UBE L2 x 4 min (2 fwd/bwd) while taking subjective ?Upper trap stretch 2 x 15 sec each ?Seated cervical retraction 2 x 10 with 3 sec hold ?Sidelying thoracic rotation x 10 each ?Supine serratus press with overhead reach x 5 ?Row with green 2 x 10 ?Manual: ?Suboccipital release with gentle manual traction x 3 bouts ?STM for bilat upper trap region ?Passive upper trap and levator scap stretch ?Trigger  Point Dry Needling Treatment: ?Pre-treatment instruction: Patient instructed on dry needling rationale, procedures, and possible side effects including pain during treatment (achy,cramping feeling), bruising, drop of blood, lightheadedness, nausea, sweating. ?Patient Consent Given: Yes ?Education handout provided: No ?Muscles treated: left upper trap  ?Needle size and number: .25x15m x 1 ?Electrical stimulation performed: No ?Parameters: N/A ?Treatment response/outcome: Twitch response elicited, patient reported she did not like the feeling stating pain so discontinued treatment after single treatment ?Post-treatment instructions: Patient instructed to expect possible mild to moderate muscle soreness later today and/or tomorrow. Patient instructed in methods to reduce muscle soreness and to continue prescribed HEP. If patient was dry needled over the lung field, patient was instructed on signs and symptoms of pneumothorax and, however unlikely, to see immediate medical attention should they occur. Patient was also educated on signs and symptoms of infection and to seek medical attention should they occur. Patient verbalized understanding of these instructions and education. ? ? ?  ?PATIENT EDUCATION:  ?Education details: see treatment  ?Person educated: patient  ?Education method: Explanation, Demonstration, verbal cues  ?Education comprehension: verbalized understanding, returned demonstration, verbal cues required ?  ?HOME EXERCISE PROGRAM: ?Access Code: [email or web address] ?  ?  ?ASSESSMENT: ?CLINICAL IMPRESSION: ?*** ? ?Fair tolerance to manual therapy as patient has difficulty relaxing musculature with gentle STM and gentle cervical joint mobilizations. She is noted to have trigger points about bilateral upper traps and levator scapulae, though does not tolerate trigger point release at this time and did not tolerate TPDN well at previous sessions. Tennis ball was issued for patient to begin self STM of cervical  musculature to assist in reducing muscle tautness. Overall good tolerance to ther ex with patient requiring minimal postural cues.  ? ?Objective impairments include decreased activity tolerance, decreased ROM, decreased strength, impaired flexibility, postural dysfunction, and pain. ?  ?  ?GOALS: ?Goals reviewed with patient? Yes ?  ?SHORT TERM GOALS: ?  ?STG Name Target Date Goal status  ?1 Patient will be I with initial HEP in order to progress with therapy. ?Baseline: HEP provided at eval 12/02/2021 INITIAL  ?2 PT will review FOTO with patient by 3rd visit in order to understand expected progress and outcome with therapy. ?Baseline: FOTO assessed at eval 12/02/2021 INITIAL  ?3 Patient will report pain level </= 5/10 in order to reduce functional limitations ?Baseline: 8/10 pain at eval 12/02/2021 INITIAL  ?  ?LONG TERM GOALS:  ?  ?LTG Name Target Date Goal status  ?1 Patient will be I with final HEP to maintain progress from PT. ?Baseline: HEP provided at eval 12/30/2021 INITIAL  ?2 Patient will report >/= 66% status on FOTO to indicate improved functional ability. ?Baseline: 46% functional  status 12/30/2021 INITIAL  ?3 Patient will demonstrate cervical rotation >/= 70 deg bilaterally in order to improve driving ability ?Baseline: 60-65 deg cervical rotation (see above) 12/30/2021 INITIAL  ?4 Patient will exhibit DNF endurance test >/= 30 sec to improve postural control and reduce pain with sitting ?Baseline: DNF endurance test 9 seconds 12/30/2021 INITIAL  ?5 Patient will report pain </= 3/10 in order to reduce functional limitation and return to prior level of activity ?Baseline: 8/10 at eval 12/30/2021 INITIAL  ? ?  ?PLAN: ?PT FREQUENCY: 1x/week ?  ?PT DURATION: 8 weeks ?  ?PLANNED INTERVENTIONS: Therapeutic exercises, Therapeutic activity, Neuro Muscular re-education, Balance training, Gait training, Patient/Family education, Joint mobilization, Aquatic Therapy, Dry Needling, Electrical stimulation, Spinal mobilization,  Cryotherapy, Moist heat, Taping, and Manual therapy ?  ?PLAN FOR NEXT SESSION: Review HEP and progress PRN, manual/dry needling for upper trap and cervical musculature, cervical mobs and thoracic mobility, DNF end

## 2022-03-02 ENCOUNTER — Encounter: Payer: Self-pay | Admitting: *Deleted

## 2022-06-26 ENCOUNTER — Ambulatory Visit
Admission: EM | Admit: 2022-06-26 | Discharge: 2022-06-26 | Disposition: A | Discharge status: Home / Self Care | Payer: 59 | Attending: Urgent Care | Admitting: Urgent Care

## 2022-06-26 DIAGNOSIS — M62838 Other muscle spasm: Secondary | ICD-10-CM | POA: Diagnosis not present

## 2022-06-26 DIAGNOSIS — B349 Viral infection, unspecified: Secondary | ICD-10-CM | POA: Diagnosis not present

## 2022-06-26 DIAGNOSIS — M542 Cervicalgia: Secondary | ICD-10-CM

## 2022-06-26 DIAGNOSIS — L282 Other prurigo: Secondary | ICD-10-CM

## 2022-06-26 DIAGNOSIS — R0789 Other chest pain: Secondary | ICD-10-CM | POA: Diagnosis present

## 2022-06-26 DIAGNOSIS — M5030 Other cervical disc degeneration, unspecified cervical region: Secondary | ICD-10-CM

## 2022-06-26 DIAGNOSIS — R058 Other specified cough: Secondary | ICD-10-CM | POA: Insufficient documentation

## 2022-06-26 DIAGNOSIS — Z20822 Contact with and (suspected) exposure to covid-19: Secondary | ICD-10-CM | POA: Insufficient documentation

## 2022-06-26 LAB — SARS CORONAVIRUS 2 (TAT 6-24 HRS): SARS Coronavirus 2: NEGATIVE

## 2022-06-26 MED ORDER — CETIRIZINE HCL 10 MG PO TABS: 10.0000 mg | ORAL_TABLET | Freq: Every day | ORAL | 0 refills | Status: DC

## 2022-06-26 MED ORDER — PROMETHAZINE-DM 6.25-15 MG/5ML PO SYRP: 2.5000 mL | ORAL_SOLUTION | Freq: Three times a day (TID) | ORAL | 0 refills | Status: DC | PRN

## 2022-06-26 MED ORDER — TIZANIDINE HCL 4 MG PO TABS: 4.0000 mg | ORAL_TABLET | Freq: Every day | ORAL | 0 refills | Status: DC

## 2022-06-26 MED ORDER — PREDNISONE 20 MG PO TABS: ORAL_TABLET | ORAL | 0 refills | Status: DC

## 2022-06-26 NOTE — ED Provider Notes (Signed)
[facility]  Note:  This document was prepared using Systems analyst and may include unintentional dictation errors.  MRN: [id number] DOB: 17-Dec-1965  Subjective:   Cassandra Osborne is a 56 y.o. female presenting for 4-day history of acute onset sinus congestion, postnasal drainage, frontal headaches, right-sided neck pain that radiates into the trapezius and to her right temple. Has also had chest pain, productive cough. Has been using DayQuil, Flonase, Afrin, ibuprofen. No smoking. No history of asthma.  Has a history of degenerative disc disease, cervical spondylosis.  No current facility-administered medications for this encounter.  Current Outpatient Medications:    amLODipine (NORVASC) 5 MG tablet, Take 1 tablet (5 mg total) by mouth daily., Disp: 90 tablet, Rfl: 3   baclofen (LIORESAL) 10 MG tablet, Take 1 tablet (10 mg total) by mouth 3 (three) times daily., Disp: 30 each, Rfl: 0   cetirizine (ZYRTEC) 10 MG tablet, Take 1 tablet (10 mg total) by mouth daily., Disp: 30 tablet, Rfl: 11   clotrimazole-betamethasone (LOTRISONE) cream, Apply 1 application topically 2 (two) times daily. X 2 weeks, Disp: 30 g, Rfl: 0   diclofenac Sodium (VOLTAREN) 1 % GEL, Apply 4 g topically 4 (four) times daily., Disp: 150 g, Rfl: 1   fluticasone (FLONASE) 50 MCG/ACT nasal spray, fluticasone propionate 50 mcg/actuation nasal spray,suspension  USE 1 SPRAY IN EACH NOSTRIL DAILY FOR 30 DAYS., Disp: , Rfl:    ibuprofen (ADVIL) 600 MG tablet, Take 1 tablet (600 mg total) by mouth every 6 (six) hours as needed., Disp: 20 tablet, Rfl: 0   predniSONE (DELTASONE) 20 MG tablet, Take 2 tablets daily with breakfast., Disp: 10 tablet, Rfl: 0   rosuvastatin (CRESTOR) 20 MG tablet, Take 1 tablet (20 mg total) by mouth daily., Disp: 90 tablet, Rfl: 3   Vitamin D, Ergocalciferol, (DRISDOL) 1.25 MG (50000 UNIT) CAPS capsule, TAKE 1 CAPSULE BY MOUTH EVERY 7 DAYS ON SUNDAY, Disp: 5  capsule, Rfl: 0   Allergies  Allergen Reactions   Septra [Sulfamethoxazole-Trimethoprim] Hives    Past Medical History:  Diagnosis Date   High cholesterol    Hypertension    No pertinent past medical history      Past Surgical History:  Procedure Laterality Date   LUMBAR LAMINECTOMY/DECOMPRESSION MICRODISCECTOMY  09/18/2012   Procedure: LUMBAR LAMINECTOMY/DECOMPRESSION MICRODISCECTOMY 1 LEVEL;  Surgeon: Winfield Cunas, MD;  Location: [place] [facility];  Service: Neurosurgery;  Laterality: Left;  Left Lumbar Four-Five Diskectomy    Family History  Problem Relation Age of Onset   Stroke Father    Diabetes Father    Hyperlipidemia Father    Hypertension Mother    Cancer Other    Breast cancer Maternal Aunt    Colon cancer Neg Hx    Esophageal cancer Neg Hx    Stomach cancer Neg Hx    Rectal cancer Neg Hx     Social History   Tobacco Use   Smoking status: Former    Types: Cigarettes   Smokeless tobacco: Never   Tobacco comments:       Vaping Use   Vaping Use: Never used  Substance Use Topics   Alcohol use: No   Drug use: No    ROS   Objective:   Vitals: BP (!) 151/72 (BP Location: Right Arm)   Pulse (!) 52   Temp 98 F (36.7 C) (Oral)   Resp 16   LMP 09/27/2014 (Approximate)   SpO2 97%   Physical Exam Constitutional:  General: She is not in acute distress.    Appearance: Normal appearance. She is well-developed and normal weight. She is not ill-appearing, toxic-appearing or diaphoretic.  HENT:     Head: Normocephalic and atraumatic.     Right Ear: Tympanic membrane, ear canal and external ear normal. No drainage or tenderness. No middle ear effusion. There is no impacted cerumen. Tympanic membrane is not erythematous.     Left Ear: Tympanic membrane, ear canal and external ear normal. No drainage or tenderness.  No middle ear effusion. There is no impacted cerumen. Tympanic membrane is not erythematous.     Nose: Congestion present. No rhinorrhea.      Mouth/Throat:     Mouth: Mucous membranes are moist. No oral lesions.     Pharynx: No pharyngeal swelling, oropharyngeal exudate, posterior oropharyngeal erythema or uvula swelling.     Tonsils: No tonsillar exudate or tonsillar abscesses.  Eyes:     General: No scleral icterus.       Right eye: No discharge.        Left eye: No discharge.     Extraocular Movements: Extraocular movements intact.     Right eye: Normal extraocular motion.     Left eye: Normal extraocular motion.     Conjunctiva/sclera: Conjunctivae normal.  Cardiovascular:     Rate and Rhythm: Normal rate and regular rhythm.     Heart sounds: Normal heart sounds. No murmur heard.    No friction rub. No gallop.  Pulmonary:     Effort: Pulmonary effort is normal. No respiratory distress.     Breath sounds: No stridor. No wheezing, rhonchi or rales.  Chest:     Chest wall: No tenderness.  Musculoskeletal:     Cervical back: Normal range of motion and neck supple. Spasms and tenderness (with associated spasms over the area outlined) present. No swelling, edema, deformity, erythema, signs of trauma, lacerations, rigidity, torticollis, bony tenderness or crepitus. Pain with movement present. Normal range of motion.       Back:     Comments: Negative Lhermitte and Spurling maneuver.  Lymphadenopathy:     Cervical: No cervical adenopathy.  Skin:    General: Skin is warm and dry.  Neurological:     General: No focal deficit present.     Mental Status: She is alert and oriented to person, place, and time.     Cranial Nerves: No cranial nerve deficit.     Motor: No weakness.     Coordination: Coordination normal.     Gait: Gait normal.  Psychiatric:        Mood and Affect: Mood normal.        Behavior: Behavior normal.     Assessment and Plan :   PDMP not reviewed this encounter.  1. Acute viral syndrome   2. Neck pain   3. Atypical chest pain   4. DDD (degenerative disc disease), cervical   5. Pruritic rash    6. Muscle spasm     In the context of her neck pain, degenerative disc disease, spondylosis and sinus symptoms recommended an oral prednisone course.  Use supportive care otherwise for what I suspect is a viral syndrome, viral respiratory illness.  COVID testing is pending. Deferred imaging given clear cardiopulmonary exam, hemodynamically stable vital signs. Counseled patient on potential for adverse effects with medications prescribed/recommended today, ER and return-to-clinic precautions discussed, patient verbalized understanding.    Jaynee Eagles, PA-C 06/26/22 1325

## 2022-06-26 NOTE — Discharge Instructions (Addendum)
We will notify you of your test results as they arrive and may take between about 24 hours.  I encourage you to sign up for MyChart if you have not already done so as this can be the easiest way for [place] to communicate results to you online or through a phone app.  Generally, we only contact you if it is a positive test result.  In the meantime, if you develop worsening symptoms including fever, chest pain, shortness of breath despite our current treatment plan then please report to the emergency room as this may be a sign of worsening status from possible viral infection.  Otherwise, we will manage this as a viral syndrome. For sore throat or cough try using a honey-based tea. Use 3 teaspoons of honey with juice squeezed from half lemon. Place shaved pieces of ginger into 1/2-1 cup of water and warm over stove top. Then mix the ingredients and repeat every 4 hours as needed. Please take Tylenol '500mg'$ -'650mg'$  every 6 hours for aches and pains, fevers. Hydrate very well with at least 2 liters of water. Eat light meals such as soups to replenish electrolytes and soft fruits, veggies. Start an antihistamine like Zyrtec for postnasal drainage, sinus congestion.  You can take this together with prednisone.  Use the cough medications as needed.   The prednisone will also help you with your neck pain.

## 2022-06-26 NOTE — ED Triage Notes (Signed)
Pt states cough,runny/stuffy nose an headache for the past 4 days.

## 2022-10-22 ENCOUNTER — Other Ambulatory Visit: Payer: Self-pay | Admitting: Family Medicine

## 2022-10-22 DIAGNOSIS — Z1231 Encounter for screening mammogram for malignant neoplasm of breast: Secondary | ICD-10-CM

## 2022-10-28 ENCOUNTER — Ambulatory Visit
Admission: RE | Admit: 2022-10-28 | Discharge: 2022-10-28 | Disposition: A | Discharge status: Home / Self Care | Payer: 59 | Source: Ambulatory Visit | Attending: Family Medicine | Admitting: Family Medicine

## 2022-10-28 DIAGNOSIS — Z1231 Encounter for screening mammogram for malignant neoplasm of breast: Secondary | ICD-10-CM

## 2022-11-05 ENCOUNTER — Ambulatory Visit: Payer: 59

## 2022-11-18 ENCOUNTER — Other Ambulatory Visit: Payer: Self-pay | Admitting: Family Medicine

## 2023-02-25 ENCOUNTER — Ambulatory Visit (INDEPENDENT_AMBULATORY_CARE_PROVIDER_SITE_OTHER): Payer: 59 | Admitting: Family Medicine

## 2023-02-25 ENCOUNTER — Ambulatory Visit
Admission: RE | Admit: 2023-02-25 | Discharge: 2023-02-25 | Disposition: A | Discharge status: Home / Self Care | Payer: 59 | Source: Ambulatory Visit | Attending: Family Medicine | Admitting: Family Medicine

## 2023-02-25 ENCOUNTER — Ambulatory Visit (HOSPITAL_COMMUNITY)
Admission: RE | Admit: 2023-02-25 | Discharge: 2023-02-25 | Disposition: A | Discharge status: Home / Self Care | Payer: 59 | Source: Ambulatory Visit | Attending: Family Medicine | Admitting: Family Medicine

## 2023-02-25 VITALS — BP 146/77 | HR 42

## 2023-02-25 DIAGNOSIS — M542 Cervicalgia: Secondary | ICD-10-CM

## 2023-02-25 DIAGNOSIS — R001 Bradycardia, unspecified: Secondary | ICD-10-CM | POA: Diagnosis not present

## 2023-02-25 DIAGNOSIS — I440 Atrioventricular block, first degree: Secondary | ICD-10-CM | POA: Diagnosis not present

## 2023-02-25 DIAGNOSIS — I10 Essential (primary) hypertension: Secondary | ICD-10-CM

## 2023-02-25 MED ORDER — MELOXICAM 15 MG PO TABS: 15.0000 mg | ORAL_TABLET | Freq: Every day | ORAL | 0 refills | Status: AC

## 2023-02-25 NOTE — Patient Instructions (Addendum)
It was nice to meet you!  Things we discussed today: Your neck and arm pain:  I think a lot of this is related to very tight muscles. However, we will check an x-ray. You can go to [facility] ([address]) any time between 7am-7pm to have this done. No appointment needed. In addition, I sent a prescription anti-inflammatory medication to your pharmacy.  Take this once daily.  Do not take ibuprofen/Aleve/Motrin/Advil while on this medication.  If you need additional pain relief you can use Tylenol only. I placed a referral to physical therapy.  Someone should call you for an appointment.  I think this will be most beneficial for you. You can continue using a heating pad in the meantime as well Your heart rate is low but your EKG did not show any concerns. I looked back in your chart and your heart rate has been low for many years. Nothing to worry about but we could consider adjusting your blood pressure medication in the future. Your blood pressure was high today.  Please continue taking your amlodipine daily. Follow up with Dr Clayborne Artist in 2 weeks for a BP visit and blood work  Stop at the front on your way out today and schedule a follow-up visit with Dr. Clayborne Artist in 2 weeks.  -Dr Anner Crete

## 2023-02-25 NOTE — Progress Notes (Unsigned)
    SUBJECTIVE:   CHIEF COMPLAINT / HPI:   Neck and Left Shoulder Pain At least 1 month Taking Aleve or Ibuprofen every 6-8 hours without relief Did PT in the past Fairly constant, worse at night trouble getting comfortable Works at a desk all day long Tried a neck pillow but also no relief Radiates down left arm Voltaren gel unhelpful  Per chart review similar issues in the past  Anxiety Specifically around medications per her report. Felt weird with gabapentin and baclofen and robaxin  PERTINENT  PMH / PSH: ***  OBJECTIVE:   LMP 09/27/2014 (Approximate)   ***  ASSESSMENT/PLAN:   No problem-specific Assessment & Plan notes found for this encounter.     Maury Dus, MD [facility]

## 2023-02-26 DIAGNOSIS — I440 Atrioventricular block, first degree: Secondary | ICD-10-CM | POA: Insufficient documentation

## 2023-02-26 NOTE — Assessment & Plan Note (Signed)
Subacute, atraumatic. Suspect symptoms mostly due to muscle tightness throughout trapezius. Likely some degree of arthritis contributing as well given radiculopathy. Check c-spine x-ray. Meloxicam 15mg  daily x10 days. Patient intolerant of gabapentin and multiple muscle relaxers in the past. Referral to PT.

## 2023-02-27 NOTE — Assessment & Plan Note (Addendum)
BP above goal today x2. Encouraged improved compliance with amlodipine 5mg  daily.

## 2023-02-27 NOTE — Assessment & Plan Note (Signed)
EKG obtained due to significant bradycardia and shows 1st degree AV block. Asymptomatic. On chart review, patient with similar HR for many years.

## 2023-02-28 ENCOUNTER — Encounter: Payer: Self-pay | Admitting: Gastroenterology

## 2023-03-02 ENCOUNTER — Other Ambulatory Visit: Payer: Self-pay

## 2023-03-02 NOTE — Progress Notes (Signed)
   Cassandra Osborne [birth year] [id number]  Patient outreached by Sharion Dove , PharmD Candidate on 03/02/2023.  Blood Pressure Readings: Last documented ambulatory systolic blood pressure: 146 Last documented ambulatory diastolic blood pressure: 77 Does the patient have a validated home blood pressure machine?: Yes They have BP readings that range on some days in the 140s-160s but also reported readings of 127s-130s after working out at the gym.   Medication review was performed. Is the patient taking their medications as prescribed?: Yes   The following barriers to adherence were noted: Does the patient have cost concerns?: No Does the patient have transportation concerns?: No Does the patient need assistance obtaining refills?: No Does the patient occassionally forget to take some of their prescribed medications?: No Does the patient feel like one/some of their medications make them feel poorly?: No Does the patient have questions or concerns about their medications?: No Does the patient have a follow up scheduled with their primary care provider/cardiologist?: Yes  Patient has been adherent to their BP medications and has a validated home blood pressure machine (arm cuff). The patient knows the proper steps to take an accurate BP reading. The patient enjoys going to the gym and also reports taking BP readings with the gym's BP machine. The patient has recently been experiencing severe pain in the neck, shoulder, and knee and really needs to address this at their next appointment with their PCP. Patient states that they have had problems falling asleep some nights and has taken Percocet which has relieved some of the pain. Patient did not have any concerns relating to their BP medication.  Interventions: Interventions Completed: Medications were reviewed, Patient was educated on goal blood pressures and long term health implications of elevated blood pressure, Patient was educated on  proper technique to check home blood pressure and reminded to bring home machine and readings to next provider appointment  The patient has follow up scheduled:  PCP: Evelena Leyden, DO   Anjelica Gorniak, Student-PharmD

## 2023-03-03 ENCOUNTER — Encounter: Payer: Self-pay | Admitting: Family Medicine

## 2023-03-03 ENCOUNTER — Ambulatory Visit (INDEPENDENT_AMBULATORY_CARE_PROVIDER_SITE_OTHER): Payer: 59 | Admitting: Family Medicine

## 2023-03-03 VITALS — BP 158/86 | HR 95 | Ht 66.0 in | Wt 193.4 lb

## 2023-03-03 DIAGNOSIS — R7303 Prediabetes: Secondary | ICD-10-CM

## 2023-03-03 DIAGNOSIS — I10 Essential (primary) hypertension: Secondary | ICD-10-CM

## 2023-03-03 DIAGNOSIS — M25561 Pain in right knee: Secondary | ICD-10-CM | POA: Diagnosis not present

## 2023-03-03 DIAGNOSIS — E782 Mixed hyperlipidemia: Secondary | ICD-10-CM | POA: Diagnosis not present

## 2023-03-03 LAB — POCT GLYCOSYLATED HEMOGLOBIN (HGB A1C): HbA1c, POC (controlled diabetic range): 6.1 % (ref 0.0–7.0)

## 2023-03-03 MED ORDER — AMLODIPINE BESYLATE 10 MG PO TABS: 10.0000 mg | ORAL_TABLET | Freq: Every day | ORAL | 3 refills | Status: DC

## 2023-03-03 MED ORDER — SIMVASTATIN 10 MG PO TABS: 10.0000 mg | ORAL_TABLET | Freq: Every day | ORAL | 0 refills | Status: DC

## 2023-03-03 NOTE — Progress Notes (Signed)
    SUBJECTIVE:   CHIEF COMPLAINT / HPI:   HTN: - Medications: amlodipine 5mg  daily - Compliance: good - Checking BP at home: 140s/80s - Denies any SOB, CP, vision changes, LE edema, medication SEs, or symptoms of hypotension  Neck/Shoulder pain - Evaluated previously on 5/31 - Having radiation down her left arm - Meloxicam didn't help and isn't taking any other OTC medications  Right knee Pain - started on Monday/Tuesday - Was working in the yard and playing horseshoes, felt knee popping afterwards - Later that night she had significant pain with extension - Friend gave her 2 percussit and it helped her go to sleep - Is having trouble with ambulation   PERTINENT  PMH / PSH: Reviewed  OBJECTIVE:   BP (!) 158/86   Pulse 95   Ht 5\' 6"  (1.676 m)   Wt 193 lb 6.4 oz (87.7 kg)   LMP 09/27/2014 (Approximate)   SpO2 100%   BMI 31.22 kg/m   General: NAD, well-appearing, well-nourished Respiratory: No respiratory distress, breathing comfortably, able to speak in full sentences Skin: warm and dry, no rashes noted on exposed skin Psych: Appropriate affect and mood Right knee: - Inspection: no gross deformity. No swelling/effusion, erythema or bruising. Skin intact - Palpation: TTP over the lateral patellar region and inferior to patella - ROM: slightly decreased active ROM with extension secondary to pain - Strength: 5/5 strength - Neuro/vasc: NV intact - Special Tests: - LIGAMENTS: negative anterior and posterior drawer, negative Lachman's, no MCL or LCL laxity  -- MENISCUS:  negative Thessaly  -- PF JOINT: negative patellar grind   ASSESSMENT/PLAN:   HTN (hypertension) BP today is elevated.  Home medications include amlodipine 5 mg daily.  Discussed elevations with the patient and recommend an additional medication but patient would prefer to just increase the amlodipine to 10 mg that she has been on that before. - Increase amlodipine to 10 mg daily - BMP  today  Hyperlipidemia Patient noncompliant with previous prescription of rosuvastatin.  She states that she has trialed simvastatin in the past and not had any issues.  Discussed recommendation for repeat lipid panel as well as starting statin back as patient's ASCVD risk score is 16.1% using current measurements. - Simvastatin 10 mg daily send, anticipate slow increases per patient comfort - Lipid panel today  Prediabetes A1c remains stable at 6.1%  Acute pain of right knee Patient with acute right knee pain that happened after being outside and being active over the weekend.  Unclear etiology but does not appear to be meniscal based on physical examination.  Do wonder if this could be patellofemoral syndrome or related to aggravation of an osteoarthritis.  Unable to detect effusion due to body habitus, but given patient's significant gait abnormality secondary to the pain I would recommend her getting imaging with sports medicine.  Discussed at length the options for medications Toradol injection would be the most beneficial at this time for patient as she failed meloxicam recently. - Toradol 30 mg IM injection - Ambulatory referral to sports medicine, patient provided with phone number to call and schedule plan - Continue with right knee brace     Cassandra Leyden, DO [place] [facility]

## 2023-03-03 NOTE — Assessment & Plan Note (Signed)
Patient noncompliant with previous prescription of rosuvastatin.  She states that she has trialed simvastatin in the past and not had any issues.  Discussed recommendation for repeat lipid panel as well as starting statin back as patient's ASCVD risk score is 16.1% using current measurements. - Simvastatin 10 mg daily send, anticipate slow increases per patient comfort - Lipid panel today

## 2023-03-03 NOTE — Patient Instructions (Addendum)
Today's is the following:  Knee pain - I am putting in a referral to sports medicine, I want you to call their office at [phone] later today to try and get an appointment for either today or tomorrow to get an ultrasound of your knee just to make sure there is nothing serious going on. - We are going to do an injection of Toradol, which is an anti-inflammatory today.  He can take Tylenol as needed but try to avoid ibuprofen for at least a day.  Blood pressure - Her blood pressure is elevated, we are increasing your amlodipine to 10 mg - If you want to double up and take two 5 mg doses to use of that prescription that is fine - We are going to check your kidney function today as well  We are also going to check your cholesterol, it is still going to be important for you to start a cholesterol medication as your risk for heart attack and stroke in the next 10 years is higher than we would prefer.

## 2023-03-03 NOTE — Assessment & Plan Note (Addendum)
BP today is elevated.  Home medications include amlodipine 5 mg daily.  Discussed elevations with the patient and recommend an additional medication but patient would prefer to just increase the amlodipine to 10 mg that she has been on that before. - Increase amlodipine to 10 mg daily - BMP today

## 2023-03-03 NOTE — Assessment & Plan Note (Signed)
Patient with acute right knee pain that happened after being outside and being active over the weekend.  Unclear etiology but does not appear to be meniscal based on physical examination.  Do wonder if this could be patellofemoral syndrome or related to aggravation of an osteoarthritis.  Unable to detect effusion due to body habitus, but given patient's significant gait abnormality secondary to the pain I would recommend her getting imaging with sports medicine.  Discussed at length the options for medications Toradol injection would be the most beneficial at this time for patient as she failed meloxicam recently. - Toradol 30 mg IM injection - Ambulatory referral to sports medicine, patient provided with phone number to call and schedule plan - Continue with right knee brace

## 2023-03-03 NOTE — Assessment & Plan Note (Signed)
A1c remains stable at 6.1%

## 2023-03-04 LAB — BASIC METABOLIC PANEL
BUN/Creatinine Ratio: 15 (ref 9–23)
BUN: 10 mg/dL (ref 6–24)
CO2: 23 mmol/L (ref 20–29)
Calcium: 9.6 mg/dL (ref 8.7–10.2)
Chloride: 107 mmol/L — ABNORMAL HIGH (ref 96–106)
Creatinine, Ser: 0.67 mg/dL (ref 0.57–1.00)
Glucose: 92 mg/dL (ref 70–99)
Potassium: 3.9 mmol/L (ref 3.5–5.2)
Sodium: 142 mmol/L (ref 134–144)
eGFR: 101 mL/min/{1.73_m2} (ref >59)

## 2023-03-04 LAB — LIPID PANEL
Chol/HDL Ratio: 4.6 ratio — ABNORMAL HIGH (ref 0.0–4.4)
Cholesterol, Total: 238 mg/dL — ABNORMAL HIGH (ref 100–199)
HDL: 52 mg/dL (ref >39)
LDL Chol Calc (NIH): 169 mg/dL — ABNORMAL HIGH (ref 0–99)
Triglycerides: 97 mg/dL (ref 0–149)
VLDL Cholesterol Cal: 17 mg/dL (ref 5–40)

## 2023-03-04 MED ORDER — KETOROLAC TROMETHAMINE 30 MG/ML IJ SOLN
30.0000 mg | Freq: Once | INTRAMUSCULAR | Status: AC
  Administered 2023-03-03: 30 mg via INTRAMUSCULAR

## 2023-03-04 NOTE — Addendum Note (Signed)
Addended by: Aquilla Solian on: 03/04/2023 01:25 PM   Modules accepted: Orders

## 2023-03-09 ENCOUNTER — Ambulatory Visit (INDEPENDENT_AMBULATORY_CARE_PROVIDER_SITE_OTHER): Payer: 59 | Admitting: Sports Medicine

## 2023-03-09 ENCOUNTER — Other Ambulatory Visit: Payer: Self-pay

## 2023-03-09 VITALS — BP 138/84 | Ht 66.0 in | Wt 190.0 lb

## 2023-03-09 DIAGNOSIS — M25561 Pain in right knee: Secondary | ICD-10-CM | POA: Diagnosis not present

## 2023-03-09 MED ORDER — METHYLPREDNISOLONE ACETATE 40 MG/ML IJ SUSP
40.0000 mg | Freq: Once | INTRAMUSCULAR | Status: AC
  Administered 2023-03-09: 40 mg via INTRA_ARTICULAR

## 2023-03-09 NOTE — Progress Notes (Signed)
PROMISE CREA - 57 y.o. female MRN [id number]  Date of birth: 02-07-1966    CHIEF COMPLAINT:   Right knee pain    SUBJECTIVE:   HPI:  Pleasant 57 year old female comes to clinic evaluated for right knee pain.  Started hurting about a week ago.  She was playing horseshoes in the yard and felt the knee buckle.  Ever since she has had swelling and pain in the lateral aspect of the knee.  Is made worse with weightbearing.  She has been walking with a limp.  She feels like the knee is catching and locking and giving out on her.  She has tried taking meloxicam that she got from her primary care doctor but says that has not helped any.  She does think she has a history of arthritis in the knee from some old x-rays years ago.  ROS:     See HPI  PERTINENT  PMH / PSH FH / / SH:  Past Medical, Surgical, Social, and Family History Reviewed & Updated in the EMR.  Pertinent findings include:  none  OBJECTIVE: BP 138/84   Ht 5\' 6"  (1.676 m)   Wt 190 lb (86.2 kg)   LMP 09/27/2014 (Approximate)   BMI 30.67 kg/m   Physical Exam:  Vital signs are reviewed.  GEN: Alert and oriented, NAD Pulm: Breathing unlabored PSY: normal mood, congruent affect  MSK: R KNEE - there is an effusion present.  No overlying skin changes.  She is nontender to palpation at the medial joint line.  Very tender to palpation of the lateral joint line.  Range of motion of the knee is 5 degrees of extension to 120 degrees of flexion.  4/5 strength with resisted knee extension and flexion.  No pain with valgus or varus stressing.  Firm endpoint on Lachman.  She does have a positive Thessaly test.  ULTRASOUND: MSK ultrasound knee: Images were obtained both in the transverse and longitudinal plane. Patellar and quadriceps tendons were well visualized with no abnormalities. Moderate sized effusion in suprapatellar pouch. Medial and lateral menisci were well visualized. There is some outpouching of both menisci, lateral  greater than medial, without any surrounding hypoechoic changes or discrete tears seen  Impression: Effusion with degenerative meniscal changes   ASSESSMENT & PLAN:  1. 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  She is already tried a week of anti-inflammatories not gotten much better.  She would like to try an intra-articular corticosteroid injection today which I think is a reasonable neck step.  Will do that today.  I encouraged her to continue meloxicam. I also encouraged her to wear compression sleeve on the knee and try some quad strengthening exercises.  Will see her back in about 4 weeks.  If no improvement, I would get an x-ray of the knee plus or minus an MRI if she really does not have any degenerative changes.  All questions answered she agrees to plan.  Procedure performed:  Right Knee Intraarticular Corticosteroid Injection; palpation guided  Consent obtained and verified. Time-out conducted. Noted no overlying erythema, induration, or other signs of local infection. The right anterior lateral joint space was palpated and marked. The overlying skin was prepped in a sterile fashion. Topical analgesic spray: Ethyl chloride. Needle: 25 gauge, 1.5 inch Meds: 40 mg methylrprednisolone, 2 ml 1% lidocaine without epinephrine Completed without difficulty  Advised to call if fevers/chills, erythema, induration,  drainage, or persistent bleeding.     Arvella Nigh, MD PGY-4, Sports Medicine Fellow [facility]   Addendum:  I was the preceptor for this visit and available for immediate consultation.  Norton Blizzard MD Marrianne Mood

## 2023-04-07 ENCOUNTER — Ambulatory Visit: Payer: 59 | Admitting: Family Medicine

## 2023-04-25 ENCOUNTER — Ambulatory Visit: Payer: 59 | Admitting: Family Medicine

## 2023-04-25 VITALS — BP 150/86 | Ht 66.0 in | Wt 187.0 lb

## 2023-04-25 DIAGNOSIS — M25561 Pain in right knee: Secondary | ICD-10-CM

## 2023-04-25 NOTE — Patient Instructions (Signed)
We will go ahead with x-rays of your knee. I suspect you tore meniscus in your knee but it's possible the arthritis is worse than it was previously. If the x-rays are normal or show only mild arthritis we will go ahead with an MRI of your knee.

## 2023-04-26 ENCOUNTER — Encounter: Payer: Self-pay | Admitting: Family Medicine

## 2023-04-26 NOTE — Progress Notes (Signed)
PCP: Nelia Shi, MD  Subjective:   HPI: Patient is a 57 y.o. female here for right knee pain.  6/12: Pleasant 57 year old female comes to clinic evaluated for right knee pain.  Started hurting about a week ago.  She was playing horseshoes in the yard and felt the knee buckle.  Ever since she has had swelling and pain in the lateral aspect of the knee.  Is made worse with weightbearing.  She has been walking with a limp.  She feels like the knee is catching and locking and giving out on her.  She has tried taking meloxicam that she got from her primary care doctor but says that has not helped any.  She does think she has a history of arthritis in the knee from some old x-rays years ago.  7/30: Patient reports injection helped only the day she received it on 6/12. Continues to have same pain in knee. Noted catching of knee. No giving out or locking.  Past Medical History:  Diagnosis Date   High cholesterol    Hypertension    No pertinent past medical history     Current Outpatient Medications on File Prior to Visit  Medication Sig Dispense Refill   amLODipine (NORVASC) 10 MG tablet Take 1 tablet (10 mg total) by mouth at bedtime. 90 tablet 3   baclofen (LIORESAL) 10 MG tablet Take 1 tablet (10 mg total) by mouth 3 (three) times daily. (Patient not taking: Reported on 02/26/2023) 30 each 0   cetirizine (ZYRTEC) 10 MG tablet Take 1 tablet (10 mg total) by mouth daily. (Patient not taking: Reported on 02/26/2023) 90 tablet 0   clotrimazole-betamethasone (LOTRISONE) cream Apply 1 application topically 2 (two) times daily. X 2 weeks 30 g 0   diclofenac Sodium (VOLTAREN) 1 % GEL Apply 4 g topically 4 (four) times daily. 150 g 1   fluticasone (FLONASE) 50 MCG/ACT nasal spray fluticasone propionate 50 mcg/actuation nasal spray,suspension  USE 1 SPRAY IN EACH NOSTRIL DAILY FOR 30 DAYS.     ibuprofen (ADVIL) 600 MG tablet Take 1 tablet (600 mg total) by mouth every 6 (six) hours as needed. 20  tablet 0   simvastatin (ZOCOR) 10 MG tablet Take 1 tablet (10 mg total) by mouth daily. 90 tablet 0   tiZANidine (ZANAFLEX) 4 MG tablet Take 1 tablet (4 mg total) by mouth at bedtime. (Patient not taking: Reported on 02/26/2023) 30 tablet 0   Vitamin D, Ergocalciferol, (DRISDOL) 1.25 MG (50000 UNIT) CAPS capsule TAKE 1 CAPSULE BY MOUTH EVERY 7 DAYS ON SUNDAY 5 capsule 0   No current facility-administered medications on file prior to visit.    Past Surgical History:  Procedure Laterality Date   LUMBAR LAMINECTOMY/DECOMPRESSION MICRODISCECTOMY  09/18/2012   Procedure: LUMBAR LAMINECTOMY/DECOMPRESSION MICRODISCECTOMY 1 LEVEL;  Surgeon: Carmela Hurt, MD;  Location: [facility];  Service: Neurosurgery;  Laterality: Left;  Left Lumbar Four-Five Diskectomy    Allergies  Allergen Reactions   Septra [Sulfamethoxazole-Trimethoprim] Hives    BP (!) 150/86   Ht 5\' 6"  (1.676 m)   Wt 187 lb (84.8 kg)   LMP 09/27/2014 (Approximate)   BMI 30.18 kg/m       No data to display              No data to display              Objective:  Physical Exam:  Gen: NAD, comfortable in exam room  Right knee: No gross deformity, ecchymoses, swelling.  TTP medial joint line. FROM with normal strength. Negative ant/post drawers. Negative valgus/varus testing. Negative lachman.  Positive mcmurrays, equivocal apleys.  NV intact distally.   Assessment & Plan:  1. Right knee pain - concerning for medial meniscus tear.  Lack of improvement with injection and home exercise program, catching on exam with positive mcmurrays.  Will proceed with x-rays and likely MRI following this.  Consider surgical referral depending on results.

## 2023-09-28 ENCOUNTER — Ambulatory Visit
Admission: EM | Admit: 2023-09-28 | Discharge: 2023-09-28 | Disposition: A | Discharge status: Home / Self Care | Payer: 59 | Attending: Family Medicine | Admitting: Family Medicine

## 2023-09-28 ENCOUNTER — Encounter (HOSPITAL_BASED_OUTPATIENT_CLINIC_OR_DEPARTMENT_OTHER): Payer: Self-pay | Admitting: Emergency Medicine

## 2023-09-28 ENCOUNTER — Other Ambulatory Visit: Payer: Self-pay

## 2023-09-28 DIAGNOSIS — J069 Acute upper respiratory infection, unspecified: Secondary | ICD-10-CM | POA: Diagnosis not present

## 2023-09-28 DIAGNOSIS — M79605 Pain in left leg: Secondary | ICD-10-CM | POA: Diagnosis not present

## 2023-09-28 DIAGNOSIS — M25562 Pain in left knee: Secondary | ICD-10-CM | POA: Insufficient documentation

## 2023-09-28 DIAGNOSIS — I10 Essential (primary) hypertension: Secondary | ICD-10-CM | POA: Diagnosis not present

## 2023-09-28 DIAGNOSIS — R0981 Nasal congestion: Secondary | ICD-10-CM | POA: Diagnosis not present

## 2023-09-28 DIAGNOSIS — M79662 Pain in left lower leg: Secondary | ICD-10-CM

## 2023-09-28 DIAGNOSIS — M7989 Other specified soft tissue disorders: Secondary | ICD-10-CM | POA: Diagnosis present

## 2023-09-28 LAB — COMPREHENSIVE METABOLIC PANEL
ALT: 13 U/L (ref 0–44)
AST: 15 U/L (ref 15–41)
Albumin: 4.5 g/dL (ref 3.5–5.0)
Alkaline Phosphatase: 75 U/L (ref 38–126)
Anion gap: 8 (ref 5–15)
BUN: 15 mg/dL (ref 6–20)
CO2: 26 mmol/L (ref 22–32)
Calcium: 9.6 mg/dL (ref 8.9–10.3)
Chloride: 108 mmol/L (ref 98–111)
Creatinine, Ser: 0.7 mg/dL (ref 0.44–1.00)
GFR, Estimated: >60 mL/min (ref >60)
Glucose, Bld: 107 mg/dL — ABNORMAL HIGH (ref 70–99)
Potassium: 3.8 mmol/L (ref 3.5–5.1)
Sodium: 142 mmol/L (ref 135–145)
Total Bilirubin: 0.3 mg/dL (ref 0.0–1.2)
Total Protein: 6.5 g/dL (ref 6.5–8.1)

## 2023-09-28 LAB — CBC WITH DIFFERENTIAL/PLATELET
Abs Immature Granulocytes: 0 10*3/uL (ref 0.00–0.07)
Basophils Absolute: 0 10*3/uL (ref 0.0–0.1)
Basophils Relative: 1 %
Eosinophils Absolute: 0.3 10*3/uL (ref 0.0–0.5)
Eosinophils Relative: 7 %
HCT: 38.4 % (ref 36.0–46.0)
Hemoglobin: 12 g/dL (ref 12.0–15.0)
Immature Granulocytes: 0 %
Lymphocytes Relative: 56 %
Lymphs Abs: 2.6 10*3/uL (ref 0.7–4.0)
MCH: 23.6 pg — ABNORMAL LOW (ref 26.0–34.0)
MCHC: 31.3 g/dL (ref 30.0–36.0)
MCV: 75.4 fL — ABNORMAL LOW (ref 80.0–100.0)
Monocytes Absolute: 0.5 10*3/uL (ref 0.1–1.0)
Monocytes Relative: 10 %
Neutro Abs: 1.2 10*3/uL — ABNORMAL LOW (ref 1.7–7.7)
Neutrophils Relative %: 26 %
Platelets: 229 10*3/uL (ref 150–400)
RBC: 5.09 MIL/uL (ref 3.87–5.11)
RDW: 17.6 % — ABNORMAL HIGH (ref 11.5–15.5)
WBC: 4.6 10*3/uL (ref 4.0–10.5)
nRBC: 0 % (ref 0.0–0.2)

## 2023-09-28 NOTE — ED Notes (Signed)
 Patient is being discharged from the Urgent Care and sent to the Emergency Department via POV. Per Myla Bold, NP, patient is in need of higher level of care due to rule out DVT. Patient is aware and verbalizes understanding of plan of care.  Vitals:   09/28/23 1002 09/28/23 1003  BP:  (!) 170/93  Pulse:  60  Resp: 16 16  Temp:  97.7 F (36.5 C)  SpO2:  98%

## 2023-09-28 NOTE — ED Triage Notes (Signed)
 Pt presents to UC for c/o nasal congestion, sore throat, coughing,  fever x2 days. Has not used otc meds. Hx Nasal polyp.  Pt reports left leg pain x2 weeks. Pain is in the left lower leg. Pt reports pain to lift or bend leg and pain radiates to left upper leg at times. Pt is limping to room. Denies falling or direct injury   Has tried Aleve , Motrin , Ice, heat, stretching, walking w/o relief.

## 2023-09-28 NOTE — Discharge Instructions (Addendum)
 Please go to the ER for further workup of your symptoms

## 2023-09-28 NOTE — ED Triage Notes (Addendum)
 Pt reports left leg pain. Reports it begins in the calf and shoots up her thigh. Reports she wants to rule out a blood clot. Denies redness to area.  Pt hypertensive in triage reports she took her BP meds today. Denies headache, blurry vision, [facility]

## 2023-09-28 NOTE — ED Provider Notes (Signed)
 [facility]    CSN: [id number] Arrival date & time: 09/28/23  9146      History   Chief Complaint No chief complaint on file.   HPI Cassandra Osborne is a 58 y.o. female presents for evaluation of left lower leg pain and nasal congestion.  Patient reports 2 weeks of a persistent left lower posterior calf pain that radiates around to her anterior calf and up into her thigh.  Denies any known injury or inciting event.  Did travel via car to [place]  for the holidays but otherwise no long distance travel.  Did not notice any redness or warmth.  Pain is worse with walking.  Denies chest pain or shortness of breath.  Denies any history of blood clots or clotting disorders.  She is concerned for DVT given her symptoms.  In addition she reports 2 days of nasal congestion, mild cough, sore throat.  Denies any fevers, ear pain, body aches, shortness of breath.  No asthma or smoking history.  She has been taking Aleve , Motrin , ice, heat, stretching without improvement of any of her symptoms.  No other concerns at this time.  HPI  Past Medical History:  Diagnosis Date   High cholesterol    Hypertension    No pertinent past medical history     Patient Active Problem List   Diagnosis Date Noted   1st degree AV block 02/26/2023   Foot pain 11/04/2020   Acute right-sided low back pain without sciatica 07/29/2020   Neck pain 02/04/2020   GERD (gastroesophageal reflux disease) 02/04/2020   Anxiety attack 11/02/2019   Vitamin D  deficiency 09/28/2019   Balance problem 06/15/2019   HTN (hypertension) 10/04/2018   Acute pain of right knee 03/20/2018   Dyspepsia 11/25/2017   Hyperlipidemia 12/19/2015   Prediabetes 12/19/2015    Past Surgical History:  Procedure Laterality Date   LUMBAR LAMINECTOMY/DECOMPRESSION MICRODISCECTOMY  09/18/2012   Procedure: LUMBAR LAMINECTOMY/DECOMPRESSION MICRODISCECTOMY 1 LEVEL;  Surgeon: Rockey LITTIE Peru, MD;  Location: [facility];  Service:  Neurosurgery;  Laterality: Left;  Left Lumbar Four-Five Diskectomy    OB History     Gravida  2   Para  2   Term  2   Preterm      AB      Living  2      SAB      IAB      Ectopic      Multiple      Live Births  2            Home Medications    Prior to Admission medications   Medication Sig Start Date End Date Taking? Authorizing Provider  amLODipine  (NORVASC ) 10 MG tablet Take 1 tablet (10 mg total) by mouth at bedtime. 03/03/23  Yes Lilland, Alana, DO  baclofen  (LIORESAL ) 10 MG tablet Take 1 tablet (10 mg total) by mouth 3 (three) times daily. Patient not taking: Reported on 02/26/2023 10/21/21   Lilland, Alana, DO  cetirizine  (ZYRTEC ) 10 MG tablet Take 1 tablet (10 mg total) by mouth daily. Patient not taking: Reported on 02/26/2023 06/26/22   Christopher Savannah, PA-C  clotrimazole -betamethasone  (LOTRISONE ) cream Apply 1 application topically 2 (two) times daily. X 2 weeks 01/30/20   Jazzlynn, Jordan, DO  diclofenac  Sodium (VOLTAREN ) 1 % GEL Apply 4 g topically 4 (four) times daily. 01/30/20   Yaritsa, Jordan, DO  fluticasone (FLONASE) 50 MCG/ACT nasal spray fluticasone propionate 50 mcg/actuation nasal spray,suspension  USE 1 SPRAY IN  EACH NOSTRIL DAILY FOR 30 DAYS.    [provider]  ibuprofen  (ADVIL ) 600 MG tablet Take 1 tablet (600 mg total) by mouth every 6 (six) hours as needed. 10/14/21   Charlyn Sora, MD  simvastatin  (ZOCOR ) 10 MG tablet Take 1 tablet (10 mg total) by mouth daily. 03/03/23   Lilland, Alana, DO  tiZANidine  (ZANAFLEX ) 4 MG tablet Take 1 tablet (4 mg total) by mouth at bedtime. Patient not taking: Reported on 02/26/2023 06/26/22   Christopher Savannah, PA-C  Vitamin D , Ergocalciferol , (DRISDOL ) 1.25 MG (50000 UNIT) CAPS capsule TAKE 1 CAPSULE BY MOUTH EVERY 7 DAYS ON SUNDAY 05/05/20   Lilland, Alana, DO    Family History Family History  Problem Relation Age of Onset   Stroke Father    Diabetes Father    Hyperlipidemia Father    Hypertension Mother     Cancer Other    Breast cancer Maternal Aunt    Colon cancer Neg Hx    Esophageal cancer Neg Hx    Stomach cancer Neg Hx    Rectal cancer Neg Hx     Social History Social History   Tobacco Use   Smoking status: Former    Types: Cigarettes   Smokeless tobacco: Never   Tobacco comments:       Vaping Use   Vaping status: Never Used  Substance Use Topics   Alcohol use: No   Drug use: No     Allergies   Septra [sulfamethoxazole-trimethoprim]   Review of Systems Review of Systems  HENT:  Positive for congestion.   Musculoskeletal:        Left calf pain     Physical Exam Triage Vital Signs ED Triage Vitals  Encounter Vitals Group     BP 09/28/23 1003 (!) 170/93     Systolic BP Percentile --      Diastolic BP Percentile --      Pulse Rate 09/28/23 1003 60     Resp 09/28/23 1002 16     Temp 09/28/23 1003 97.7 F (36.5 C)     Temp Source 09/28/23 1002 Oral     SpO2 09/28/23 1003 98 %     Weight --      Height --      Head Circumference --      Peak Flow --      Pain Score 09/28/23 0951 8     Pain Loc --      Pain Education --      Exclude from Growth Chart --    No data found.  Updated Vital Signs BP (!) 170/93 (BP Location: Right Arm)   Pulse 60   Temp 97.7 F (36.5 C) (Oral)   Resp 16   LMP 09/27/2014 (Approximate)   SpO2 98%   Visual Acuity Right Eye Distance:   Left Eye Distance:   Bilateral Distance:    Right Eye Near:   Left Eye Near:    Bilateral Near:     Physical Exam Vitals and nursing note reviewed.  Constitutional:      General: She is not in acute distress.    Appearance: She is well-developed. She is not ill-appearing.  HENT:     Head: Normocephalic and atraumatic.     Right Ear: Tympanic membrane and ear canal normal.     Left Ear: Tympanic membrane and ear canal normal.     Nose: Congestion present.     Mouth/Throat:     Mouth: Mucous membranes are moist.  Pharynx: Oropharynx is clear. Uvula midline. No oropharyngeal  exudate or posterior oropharyngeal erythema.     Tonsils: No tonsillar exudate or tonsillar abscesses.  Eyes:     Conjunctiva/sclera: Conjunctivae normal.     Pupils: Pupils are equal, round, and reactive to light.  Cardiovascular:     Rate and Rhythm: Normal rate and regular rhythm.     Heart sounds: Normal heart sounds.  Pulmonary:     Effort: Pulmonary effort is normal.     Breath sounds: Normal breath sounds.  Musculoskeletal:     Cervical back: Normal range of motion and neck supple.     Right lower leg: Normal.     Left lower leg: Tenderness present. No deformity, lacerations or bony tenderness. No edema.     Comments: Very mild edema to the left anterior lower leg.  There is tenderness with palpation along the posterior calf.  There is no obvious erythema or warmth.  Left calf measures 43 cm, right calf measures 42.5 cm.  Positive Homans' sign.  Lymphadenopathy:     Cervical: No cervical adenopathy.  Skin:    General: Skin is warm and dry.  Neurological:     General: No focal deficit present.     Mental Status: She is alert and oriented to person, place, and time.  Psychiatric:        Mood and Affect: Mood normal.        Behavior: Behavior normal.    Clinical feature Score   0  Active cancer (treatment ongoing or within the previous six months or palliative) 0  Paralysis, paresis, or recent plaster immobilization of the lower extremities 0  Recently bedridden for more than three days or major surgery, within four weeks                                                    0  Localized tenderness along the distribution of the deep venous system 1  Entire leg swollen 0  Calf swelling by more than 3 cm when compared to the asymptomatic leg (measured below tibial tuberosity) 0  Pitting edema (greater in the symptomatic leg) 0  Collateral superficial veins (nonvaricose) 0  Alternative diagnosis as likely or more likely than that of deep venous thrombosis -2                                                                                                                                                              Total Score 1     Interpretation    High probability  3 or greater  Moderate probability 1 or 2  Low probability 0  or less  Modification   This clinical model has been modified to take one other clinical feature into account: a previously documented deep vein thrombosis (DVT) is given the score of 1. Using this modified scoring system, DVT is either likely or unlikely, as follows:  DVT likely 2 or greater   DVT unlikely 1 or less      UC Treatments / Results  Labs (all labs ordered are listed, but only abnormal results are displayed) Labs Reviewed - No data to display  EKG   Radiology No results found.  Procedures Procedures (including critical care time)  Medications Ordered in UC Medications - No data to display  Initial Impression / Assessment and Plan / UC Course  I have reviewed the triage vital signs and the nursing notes.  Pertinent labs & imaging results that were available during my care of the patient were reviewed by me and considered in my medical decision making (see chart for details).     Reviewed exam and symptoms with patient.  Patient is well-appearing and in no acute distress with stable vital signs.  Discussed limitations and abilities of urgent care.  Discussed viral upper respiratory illness and symptomatic treatment.  Reviewed patient concerns/exam for DVT.  Wells score of 1 indicating low probability that this is patient's primary concern.  Advised I am unable to rule this out in this setting and advised her to go to one of our med centers for further workup.  She is in agreement with plan will go POV to the ER. Final Clinical Impressions(s) / UC Diagnoses   Final diagnoses:  Pain of left calf  Nasal congestion  Viral upper respiratory illness     Discharge Instructions      Please go to the ER for further  workup of your symptoms      ED Prescriptions   None    PDMP not reviewed this encounter.   Loreda Myla SAUNDERS, NP 09/28/23 1029

## 2023-09-29 ENCOUNTER — Ambulatory Visit (HOSPITAL_BASED_OUTPATIENT_CLINIC_OR_DEPARTMENT_OTHER)
Admission: RE | Admit: 2023-09-29 | Discharge: 2023-09-29 | Disposition: A | Discharge status: Home / Self Care | Payer: 59 | Source: Ambulatory Visit | Attending: Emergency Medicine | Admitting: Emergency Medicine

## 2023-09-29 ENCOUNTER — Emergency Department (HOSPITAL_BASED_OUTPATIENT_CLINIC_OR_DEPARTMENT_OTHER)
Admission: EM | Admit: 2023-09-29 | Discharge: 2023-09-29 | Disposition: A | Discharge status: Home / Self Care | Payer: 59 | Attending: Emergency Medicine | Admitting: Emergency Medicine

## 2023-09-29 DIAGNOSIS — M79605 Pain in left leg: Secondary | ICD-10-CM | POA: Insufficient documentation

## 2023-09-29 DIAGNOSIS — M7989 Other specified soft tissue disorders: Secondary | ICD-10-CM | POA: Insufficient documentation

## 2023-09-29 MED ORDER — ENOXAPARIN SODIUM 100 MG/ML IJ SOSY
1.0000 mg/kg | PREFILLED_SYRINGE | Freq: Once | INTRAMUSCULAR | Status: AC
  Administered 2023-09-29: 87.5 mg via SUBCUTANEOUS
  Filled 2023-09-29: qty 1

## 2023-09-29 NOTE — ED Provider Notes (Signed)
 11:56 AM patient return for DVT study today.  She was seen last night with leg pain.  DVT study was negative.  Patient was informed of results.  Encouraged PCP follow-up with any persistent pain.   Desiderio Chew, PA-C 09/29/23 1156    Mannie Pac T, DO 09/30/23 [phone]

## 2023-09-29 NOTE — ED Provider Notes (Signed)
 [facility]  Provider Note  CSN: [id number] Arrival date & time: 09/28/23 2039  History Chief Complaint  Patient presents with   Leg Pain   Hypertension    Cassandra Osborne is a 58 y.o. female with history of HTN reports L leg pain/swelling for the last 2 weeks. Started prior to recent travel to [place] , but has worsened since she got home. No prior history of DVT. No CP or SOB. Went to UC and referred to ED for DVT rule out.    Home Medications Prior to Admission medications   Medication Sig Start Date End Date Taking? Authorizing Provider  amLODipine  (NORVASC ) 10 MG tablet Take 1 tablet (10 mg total) by mouth at bedtime. 03/03/23   Lilland, Alana, DO  baclofen  (LIORESAL ) 10 MG tablet Take 1 tablet (10 mg total) by mouth 3 (three) times daily. Patient not taking: Reported on 02/26/2023 10/21/21   Lilland, Alana, DO  cetirizine  (ZYRTEC ) 10 MG tablet Take 1 tablet (10 mg total) by mouth daily. Patient not taking: Reported on 02/26/2023 06/26/22   Christopher Savannah, PA-C  clotrimazole -betamethasone  (LOTRISONE ) cream Apply 1 application topically 2 (two) times daily. X 2 weeks 01/30/20   Easter, Jordan, DO  diclofenac  Sodium (VOLTAREN ) 1 % GEL Apply 4 g topically 4 (four) times daily. 01/30/20   Jeanene, Jordan, DO  fluticasone (FLONASE) 50 MCG/ACT nasal spray fluticasone propionate 50 mcg/actuation nasal spray,suspension  USE 1 SPRAY IN EACH NOSTRIL DAILY FOR 30 DAYS.    [provider]  ibuprofen  (ADVIL ) 600 MG tablet Take 1 tablet (600 mg total) by mouth every 6 (six) hours as needed. 10/14/21   Charlyn Sora, MD  simvastatin  (ZOCOR ) 10 MG tablet Take 1 tablet (10 mg total) by mouth daily. 03/03/23   Lilland, Alana, DO  tiZANidine  (ZANAFLEX ) 4 MG tablet Take 1 tablet (4 mg total) by mouth at bedtime. Patient not taking: Reported on 02/26/2023 06/26/22   Christopher Savannah, PA-C  Vitamin D , Ergocalciferol , (DRISDOL ) 1.25 MG (50000 UNIT) CAPS capsule TAKE  1 CAPSULE BY MOUTH EVERY 7 DAYS ON SUNDAY 05/05/20   Lilland, Alana, DO     Allergies    Septra [sulfamethoxazole-trimethoprim]   Review of Systems   Review of Systems Please see HPI for pertinent positives and negatives  Physical Exam BP (!) 162/78   Pulse 62   Temp 98.2 F (36.8 C)   Resp 20   Wt 87.2 kg   LMP 09/27/2014 (Approximate)   SpO2 100%   BMI 31.04 kg/m   Physical Exam Vitals and nursing note reviewed.  HENT:     Head: Normocephalic.     Nose: Nose normal.  Eyes:     Extraocular Movements: Extraocular movements intact.  Pulmonary:     Effort: Pulmonary effort is normal.  Musculoskeletal:        General: Tenderness (L popliteal fossa) present. No swelling. Normal range of motion.     Cervical back: Neck supple.     Right lower leg: No edema.     Left lower leg: No edema.  Skin:    Findings: No rash (on exposed skin).  Neurological:     Mental Status: She is alert and oriented to person, place, and time.  Psychiatric:        Mood and Affect: Mood normal.     ED Results / Procedures / Treatments   EKG None  Procedures Procedures  Medications Ordered in the ED Medications  enoxaparin  (LOVENOX ) injection 87.5  mg (has no administration in time range)    Initial Impression and Plan  Patient here with LLE pain and concern for DVT. US  is not available at this time. Labs done in triage show unremarkable CBC and BMP. Plan Lovenox  and return in AM for Doppler. BP was elevated in Triage but improved without intervention and no concerning symptoms for hypertensive emergency. loe  ED Course       MDM Rules/Calculators/A&P Medical Decision Making Problems Addressed: Left leg pain: acute illness or injury  Amount and/or Complexity of Data Reviewed Labs: ordered. Decision-making details documented in ED Course. Radiology: ordered.  Risk Prescription drug management.     Final Clinical Impression(s) / ED Diagnoses Final diagnoses:  Left leg  pain    Rx / DC Orders ED Discharge Orders          Ordered    Lower Ext Left Venous US        Comments: IMPORTANT PATIENT INSTRUCTIONS:  You have been scheduled for an Outpatient Ultrasound.    Your appointment has been scheduled for:  _______ am/pm on _______________ (date).  If your appointment is scheduled for a Saturday, Sunday or holiday, please go to the [employer] at [facility] Emergency Department Registration Desk at least 15 minutes prior to your appointment time and tell them you are there for an ultrasound.    If your appointment is scheduled for a weekday (Monday - Friday), please go directly to the [facility] Radiology Department reception area at least 15 minutes prior to your appointment time and tell them you are there for an ultrasound.  Please call [phone] with questions.   09/29/23 0149             Roselyn Carlin NOVAK, MD 09/29/23 [phone]

## 2023-11-24 ENCOUNTER — Ambulatory Visit: Payer: 59 | Admitting: Gastroenterology

## 2023-11-24 ENCOUNTER — Encounter: Payer: Self-pay | Admitting: Gastroenterology

## 2023-11-24 VITALS — BP 120/88 | HR 54 | Ht 66.0 in | Wt 192.0 lb

## 2023-11-24 DIAGNOSIS — Z860101 Personal history of adenomatous and serrated colon polyps: Secondary | ICD-10-CM

## 2023-11-24 DIAGNOSIS — R1310 Dysphagia, unspecified: Secondary | ICD-10-CM

## 2023-11-24 DIAGNOSIS — K219 Gastro-esophageal reflux disease without esophagitis: Secondary | ICD-10-CM | POA: Diagnosis not present

## 2023-11-24 DIAGNOSIS — Z860100 Personal history of colon polyps, unspecified: Secondary | ICD-10-CM

## 2023-11-24 MED ORDER — SUFLAVE 178.7 G PO SOLR
1.0000 | Freq: Once | ORAL | 0 refills | Status: AC
Start: 2023-11-24 — End: 2023-11-24

## 2023-11-24 NOTE — Progress Notes (Signed)
 Chief Complaint: dysphagia Primary GI MD: Dr. Adela Lank  HPI: 58 year old female with medical history as listed below presents for evaluation of dysphagia.  Patient had modified barium swallow 07/2021 which showed a normal oropharyngeal swallow and good pharyngeal clearance.  Previous scans of the esophagus revealed no barium stasis but showed 13 mm barium pill lodged in the thoracic esophagus requiring liquid to dislodge and GI referral was recommended.  She has experienced swallowing difficulties for several years, with food getting stuck in her esophagus, particularly solids like rice, chicken, and sometimes bread. She describes the sensation as 'something in there' and notes that when food gets stuck, 'it's bad.' She also experiences heartburn but is not currently taking any antacids. A barium swallow test in 2022 showed that the barium pill got lodged in her thoracic esophagus and required liquid to pass.  She is due for a colonoscopy, having last undergone the procedure in 2017, which revealed a small tubular adenoma, a precancerous polyp, and another non-precancerous polyp. The guidelines for follow-up have changed from every five years to every seven years, making her due for another colonoscopy now.  Her family history includes a sister with head and neck cancer and multiple maternal relatives who have died from various cancers, including breast, colon, and pancreatic cancer. However, her mother and father have no history of cancer, and her siblings are also cancer-free.  No recent heart attack, stroke, or seizures. She is not on any blood thinners.      PREVIOUS GI WORKUP   Colonoscopy 02/2016 - One 4 mm polyp in the transverse colon, removed with a cold snare. Resected and retrieved.  - One diminutive polyp in the rectum, removed with a cold biopsy forceps. Resected and retrieved.  - The examined portion of the ileum was normal.  - The examination was otherwise normal. -  Biopsies: tubular adenoma and hyperplastic polyp   Past Medical History:  Diagnosis Date   COVID    DDD (degenerative disc disease), cervical    GERD (gastroesophageal reflux disease)    High cholesterol    Hypertension    Prediabetes    Sciatica of right side    Tubular adenoma of colon    Vitamin D deficiency     Past Surgical History:  Procedure Laterality Date   LUMBAR LAMINECTOMY/DECOMPRESSION MICRODISCECTOMY  09/18/2012   Procedure: LUMBAR LAMINECTOMY/DECOMPRESSION MICRODISCECTOMY 1 LEVEL;  Surgeon: Carmela Hurt, MD;  Location: [facility];  Service: Neurosurgery;  Laterality: Left;  Left Lumbar Four-Five Diskectomy    Current Outpatient Medications  Medication Sig Dispense Refill   amLODipine (NORVASC) 10 MG tablet Take 1 tablet (10 mg total) by mouth at bedtime. 90 tablet 3   fluticasone (FLONASE) 50 MCG/ACT nasal spray Place 2 sprays into both nostrils as needed.     baclofen (LIORESAL) 10 MG tablet Take 1 tablet (10 mg total) by mouth 3 (three) times daily. (Patient not taking: Reported on 11/24/2023) 30 each 0   cetirizine (ZYRTEC) 10 MG tablet Take 1 tablet (10 mg total) by mouth daily. (Patient not taking: Reported on 02/26/2023) 90 tablet 0   clotrimazole-betamethasone (LOTRISONE) cream Apply 1 application topically 2 (two) times daily. X 2 weeks (Patient not taking: Reported on 11/24/2023) 30 g 0   diclofenac Sodium (VOLTAREN) 1 % GEL Apply 4 g topically 4 (four) times daily. (Patient not taking: Reported on 11/24/2023) 150 g 1   ibuprofen (ADVIL) 600 MG tablet Take 1 tablet (600 mg total) by mouth every  6 (six) hours as needed. (Patient not taking: Reported on 11/24/2023) 20 tablet 0   simvastatin (ZOCOR) 10 MG tablet Take 1 tablet (10 mg total) by mouth daily. (Patient not taking: Reported on 11/24/2023) 90 tablet 0   tiZANidine (ZANAFLEX) 4 MG tablet Take 1 tablet (4 mg total) by mouth at bedtime. (Patient not taking: Reported on 11/24/2023) 30 tablet 0   Vitamin D,  Ergocalciferol, (DRISDOL) 1.25 MG (50000 UNIT) CAPS capsule TAKE 1 CAPSULE BY MOUTH EVERY 7 DAYS ON SUNDAY (Patient not taking: Reported on 11/24/2023) 5 capsule 0   No current facility-administered medications for this visit.    Allergies as of 11/24/2023 - Review Complete 11/24/2023  Allergen Reaction Noted   Sulfa antibiotics Other (See Comments) 08/24/2023   Septra [sulfamethoxazole-trimethoprim] Hives 09/14/2012    Family History  Problem Relation Age of Onset   Stroke Father    Diabetes Father    Hyperlipidemia Father    Hypertension Mother    Cancer Other    Breast cancer Maternal Aunt    Colon cancer Neg Hx    Esophageal cancer Neg Hx    Stomach cancer Neg Hx    Rectal cancer Neg Hx     Social History   Socioeconomic History   Marital status: Married    Spouse name: Not on file   Number of children: Not on file   Years of education: Not on file   Highest education level: Not on file  Occupational History   Not on file  Tobacco Use   Smoking status: Former    Types: Cigarettes   Smokeless tobacco: Never   Tobacco comments:       Vaping Use   Vaping status: Never Used  Substance and Sexual Activity   Alcohol use: No   Drug use: No   Sexual activity: Yes    Birth control/protection: None  Other Topics Concern   Not on file  Social History Narrative   Not on file   Social Drivers of Health   Financial Resource Strain: Not on file  Food Insecurity: Unknown (11/29/2022)   [facility]   Hunger Vital Sign    Within the past 12 months, you worried that your food would run out before you got money to buy more: Not on file    Ran Out of Food in the Last Year: Patient declined to answer  Transportation Needs: Not on file (11/29/2022)  Physical Activity: Not on file  Stress: Not on file  Social Connections: Not on file  Intimate Partner Violence: Not on file    Review of Systems:    Constitutional: No weight loss, fever,  chills, weakness or fatigue HEENT: Eyes: No change in vision               Ears, Nose, Throat:  No change in hearing or congestion Skin: No rash or itching Cardiovascular: No chest pain, chest pressure or palpitations   Respiratory: No SOB or cough Gastrointestinal: See HPI and otherwise negative Genitourinary: No dysuria or change in urinary frequency Neurological: No headache, dizziness or syncope Musculoskeletal: No new muscle or joint pain Hematologic: No bleeding or bruising Psychiatric: No history of depression or anxiety    Physical Exam:  Vital signs: BP 120/88   Pulse (!) 54   Ht 5\' 6"  (1.676 m)   Wt 192 lb (87.1 kg)   LMP 09/27/2014 (Approximate)   BMI 30.99 kg/m   Constitutional: NAD, Well developed, Well nourished, alert  and cooperative Head:  Normocephalic and atraumatic. Eyes:   PEERL, EOMI. No icterus. Conjunctiva pink. Respiratory: Respirations even and unlabored. Lungs clear to auscultation bilaterally.   No wheezes, crackles, or rhonchi.  Cardiovascular:  Regular rate and rhythm. No peripheral edema, cyanosis or pallor.  Rectal:  Not performed.  Msk:  Symmetrical without gross deformities. Without edema, no deformity or joint abnormality.  Neurologic:  Alert and  oriented x4;  grossly normal neurologically.  Skin:   Dry and intact without significant lesions or rashes. Psychiatric: Oriented to person, place and time. Demonstrates good judgement and reason without abnormal affect or behaviors.  RELEVANT LABS AND IMAGING: CBC    Component Value Date/Time   WBC 4.6 09/28/2023 2054   RBC 5.09 09/28/2023 2054   HGB 12.0 09/28/2023 2054   HGB 12.5 07/16/2021 1411   HCT 38.4 09/28/2023 2054   HCT 37.9 07/16/2021 1411   PLT 229 09/28/2023 2054   PLT 223 07/16/2021 1411   MCV 75.4 (L) 09/28/2023 2054   MCV 78 (L) 07/16/2021 1411   MCH 23.6 (L) 09/28/2023 2054   MCHC 31.3 09/28/2023 2054   RDW 17.6 (H) 09/28/2023 2054   RDW 13.8 07/16/2021 1411   LYMPHSABS  2.6 09/28/2023 2054   MONOABS 0.5 09/28/2023 2054   EOSABS 0.3 09/28/2023 2054   BASOSABS 0.0 09/28/2023 2054    CMP     Component Value Date/Time   NA 142 09/28/2023 2054   NA 142 03/03/2023 1151   K 3.8 09/28/2023 2054   CL 108 09/28/2023 2054   CO2 26 09/28/2023 2054   GLUCOSE 107 (H) 09/28/2023 2054   BUN 15 09/28/2023 2054   BUN 10 03/03/2023 1151   CREATININE 0.70 09/28/2023 2054   CALCIUM 9.6 09/28/2023 2054   PROT 6.5 09/28/2023 2054   PROT 6.8 07/16/2021 1411   ALBUMIN 4.5 09/28/2023 2054   ALBUMIN 4.6 07/16/2021 1411   AST 15 09/28/2023 2054   ALT 13 09/28/2023 2054   ALKPHOS 75 09/28/2023 2054   BILITOT 0.3 09/28/2023 2054   BILITOT 0.4 07/16/2021 1411   GFRNONAA >60 09/28/2023 2054   GFRAA 110 01/30/2020 1739     Assessment/Plan:      Dysphagia GERD Difficulty swallowing solids for years, with occasional sensation of food getting stuck. MBS in 2022 showed pill lodged in thoracic esophagus. Possible causes include esophageal stricture, dysmotility, or reflux. She also has associated GERD but not currently on antacid. No previous EGD --Plan for endoscopy to visualize esophagus and stomach, stretch any strictures, and biopsy any suspicious areas. -Consider PPI after endoscopy findings - I thoroughly discussed the procedure with the patient (at bedside) to include nature of the procedure, alternatives, benefits, and risks (including but not limited to bleeding, infection, perforation, anesthesia/cardiac pulmonary complications).  Patient verbalized understanding and gave verbal consent to proceed with procedure.   History of colon polyps Last colonoscopy in 2017 showed one small tubular adenoma (precancerous polyp) and one non-precancerous polyp. Now due for repeat colonoscopy based on 7-year interval. -Schedule colonoscopy to screen for new polyps or other abnormalities. - I thoroughly discussed the procedure with the patient (at bedside) to include nature of  the procedure, alternatives, benefits, and risks (including but not limited to bleeding, infection, perforation, anesthesia/cardiac pulmonary complications).  Patient verbalized understanding and gave verbal consent to proceed with procedure.   General Health Maintenance Patient lacks a consistent primary care provider and has expressed interest in weight loss. -Encourage patient to establish care with a primary  care provider for ongoing health maintenance and weight loss support.     Lara Mulch [facility] 11/24/2023, 9:16 AM  Cc: Nelia Shi, MD

## 2023-11-24 NOTE — Progress Notes (Signed)
 Agree with assessment and plan as outlined.

## 2023-11-24 NOTE — Patient Instructions (Addendum)
 You have been scheduled for an endoscopy and colonoscopy. Please follow the written instructions given to you at your visit today.  We have sent the following medications to your pharmacy for you to pick up at your convenience: suflave  If you use inhalers (even only as needed), please bring them with you on the day of your procedure.  DO NOT TAKE 7 DAYS PRIOR TO TEST- Trulicity (dulaglutide) Ozempic, Wegovy (semaglutide) Mounjaro (tirzepatide) Bydureon Bcise (exanatide extended release)  DO NOT TAKE 1 DAY PRIOR TO YOUR TEST Rybelsus (semaglutide) Adlyxin (lixisenatide) Victoza (liraglutide) Byetta (exanatide) ___________________________________________________________________________  _______________________________________________________  If your blood pressure at your visit was 140/90 or greater, please contact your primary care physician to follow up on this.  _______________________________________________________  If you are age 21 or older, your body mass index should be between 23-30. Your Body mass index is 30.99 kg/m. If this is out of the aforementioned range listed, please consider follow up with your Primary Care Provider.  If you are age 14 or younger, your body mass index should be between 19-25. Your Body mass index is 30.99 kg/m. If this is out of the aformentioned range listed, please consider follow up with your Primary Care Provider.   ________________________________________________________  The [facility] providers would like to encourage you to use [facility] to communicate with providers for non-urgent requests or questions.  Due to long hold times on the telephone, sending your provider a message by [facility] may be a faster and more efficient way to get a response.  Please allow 48 business hours for a response.  Please remember that this is for non-urgent requests.  _______________________________________________________  I appreciate the opportunity to  care for you. Boone Master, PA

## 2023-12-13 ENCOUNTER — Other Ambulatory Visit: Payer: Self-pay | Admitting: Family Medicine

## 2023-12-13 DIAGNOSIS — Z1231 Encounter for screening mammogram for malignant neoplasm of breast: Secondary | ICD-10-CM

## 2023-12-27 ENCOUNTER — Encounter: Payer: Self-pay | Admitting: Gastroenterology

## 2023-12-27 ENCOUNTER — Other Ambulatory Visit: Payer: Self-pay | Admitting: Family Medicine

## 2023-12-27 ENCOUNTER — Ambulatory Visit
Admission: RE | Admit: 2023-12-27 | Discharge: 2023-12-27 | Disposition: A | Discharge status: Home / Self Care | Source: Ambulatory Visit | Attending: Family Medicine | Admitting: Family Medicine

## 2023-12-27 DIAGNOSIS — Z1231 Encounter for screening mammogram for malignant neoplasm of breast: Secondary | ICD-10-CM

## 2024-01-01 ENCOUNTER — Encounter: Payer: Self-pay | Admitting: Certified Registered Nurse Anesthetist

## 2024-01-05 ENCOUNTER — Encounter: Payer: Self-pay | Admitting: Gastroenterology

## 2024-01-05 ENCOUNTER — Ambulatory Visit: Payer: 59 | Admitting: Gastroenterology

## 2024-01-05 VITALS — BP 129/68 | HR 65 | Temp 97.9°F | Resp 14 | Ht 66.0 in | Wt 192.0 lb

## 2024-01-05 DIAGNOSIS — K5730 Diverticulosis of large intestine without perforation or abscess without bleeding: Secondary | ICD-10-CM

## 2024-01-05 DIAGNOSIS — K449 Diaphragmatic hernia without obstruction or gangrene: Secondary | ICD-10-CM

## 2024-01-05 DIAGNOSIS — K648 Other hemorrhoids: Secondary | ICD-10-CM | POA: Diagnosis not present

## 2024-01-05 DIAGNOSIS — R1310 Dysphagia, unspecified: Secondary | ICD-10-CM | POA: Diagnosis not present

## 2024-01-05 DIAGNOSIS — Z860101 Personal history of adenomatous and serrated colon polyps: Secondary | ICD-10-CM | POA: Diagnosis not present

## 2024-01-05 DIAGNOSIS — D124 Benign neoplasm of descending colon: Secondary | ICD-10-CM

## 2024-01-05 DIAGNOSIS — K2289 Other specified disease of esophagus: Secondary | ICD-10-CM

## 2024-01-05 DIAGNOSIS — D122 Benign neoplasm of ascending colon: Secondary | ICD-10-CM | POA: Diagnosis not present

## 2024-01-05 DIAGNOSIS — Z1211 Encounter for screening for malignant neoplasm of colon: Secondary | ICD-10-CM | POA: Diagnosis present

## 2024-01-05 DIAGNOSIS — D128 Benign neoplasm of rectum: Secondary | ICD-10-CM

## 2024-01-05 DIAGNOSIS — Z860100 Personal history of colon polyps, unspecified: Secondary | ICD-10-CM

## 2024-01-05 MED ORDER — SODIUM CHLORIDE 0.9 % IV SOLN: 500.0000 mL | Freq: Once | INTRAVENOUS | Status: DC

## 2024-01-05 NOTE — Op Note (Signed)
 [facility] Patient Name: Cassandra Osborne Procedure Date: 01/05/2024 2:55 PM MRN: [id number] Endoscopist: Viviann Spare P. Adela Lank , MD, [id number] Age: 58 Referring MD:  Date of Birth: Dec 18, 1965 Gender: Female Account #: [id number] Procedure:                Colonoscopy Indications:              High risk colon cancer surveillance: Personal                            history of colonic polyps - adenoma removed 02/2016 Medicines:                Monitored Anesthesia Care Procedure:                Pre-Anesthesia Assessment:                           - Prior to the procedure, a History and Physical                            was performed, and patient medications and                            allergies were reviewed. The patient's tolerance of                            previous anesthesia was also reviewed. The risks                            and benefits of the procedure and the sedation                            options and risks were discussed with the patient.                            All questions were answered, and informed consent                            was obtained. Prior Anticoagulants: The patient has                            taken no anticoagulant or antiplatelet agents. ASA                            Grade Assessment: II - A patient with mild systemic                            disease. After reviewing the risks and benefits,                            the patient was deemed in satisfactory condition to                            undergo the procedure.  After obtaining informed consent, the colonoscope                            was passed under direct vision. Throughout the                            procedure, the patient's blood pressure, pulse, and                            oxygen saturations were monitored continuously. The                            Olympus Scope [id number] was introduced through the                             anus and advanced to the the cecum, identified by                            appendiceal orifice and ileocecal valve. The                            colonoscopy was performed without difficulty. The                            patient tolerated the procedure well. The quality                            of the bowel preparation was good. The ileocecal                            valve, appendiceal orifice, and rectum were                            photographed. Scope In: 3:13:51 PM Scope Out: 3:28:44 PM Scope Withdrawal Time: 0 hours 13 minutes 10 seconds  Total Procedure Duration: 0 hours 14 minutes 53 seconds  Findings:                 The perianal and digital rectal examinations were                            normal.                           A diminutive polyp was found in the ascending                            colon. The polyp was sessile. The polyp was removed                            with a cold snare. Resection and retrieval were                            complete.  A diminutive polyp was found in the descending                            colon. The polyp was sessile. The polyp was removed                            with a cold snare. Resection and retrieval were                            complete.                           A 3 mm polyp was found in the rectum. The polyp was                            sessile. The polyp was removed with a cold snare.                            Resection and retrieval were complete.                           A few small-mouthed diverticula were found in the                            ascending colon.                           Internal hemorrhoids were found during                            retroflexion. The hemorrhoids were small.                           The exam was otherwise without abnormality. Of                            note, small size of rectum led to limited                            retroflexed  views. Complications:            No immediate complications. Estimated blood loss:                            Minimal. Estimated Blood Loss:     Estimated blood loss was minimal. Impression:               - One diminutive polyp in the ascending colon,                            removed with a cold snare. Resected and retrieved.                           - One diminutive polyp in the descending colon,  removed with a cold snare. Resected and retrieved.                           - One 3 mm polyp in the rectum, removed with a cold                            snare. Resected and retrieved.                           - Diverticulosis in the ascending colon.                           - Internal hemorrhoids.                           - The examination was otherwise normal. Recommendation:           - Patient has a contact number available for                            emergencies. The signs and symptoms of potential                            delayed complications were discussed with the                            patient. Return to normal activities tomorrow.                            Written discharge instructions were provided to the                            patient.                           - Resume previous diet.                           - Continue present medications.                           - Await pathology results. Viviann Spare P. Flynn Lininger, MD 01/05/2024 3:35:14 PM This report has been signed electronically.

## 2024-01-05 NOTE — Progress Notes (Signed)
1456 Robinul 0.1 mg IV given due large amount of secretions upon assessment.  MD made aware, vss

## 2024-01-05 NOTE — Progress Notes (Signed)
 Report given to PACU, vss

## 2024-01-05 NOTE — Progress Notes (Signed)
 Called to room to assist during endoscopic procedure.  Patient ID and intended procedure confirmed with present staff. Received instructions for my participation in the procedure from the performing physician.

## 2024-01-05 NOTE — Patient Instructions (Addendum)
 Handouts provided on diverticulosis, hemorrhoids, and polyps.  Repeat colonoscopy based on biopsy results.  Follow post dilation diet.    YOU HAD AN ENDOSCOPIC PROCEDURE TODAY AT THE [facility]:   Refer to the procedure report that was given to you for any specific questions about what was found during the examination.  If the procedure report does not answer your questions, please call your gastroenterologist to clarify.  If you requested that your care partner not be given the details of your procedure findings, then the procedure report has been included in a sealed envelope for you to review at your convenience later.  YOU SHOULD EXPECT: Some feelings of bloating in the abdomen. Passage of more gas than usual.  Walking can help get rid of the air that was put into your GI tract during the procedure and reduce the bloating. If you had a lower endoscopy (such as a colonoscopy or flexible sigmoidoscopy) you may notice spotting of blood in your stool or on the toilet paper. If you underwent a bowel prep for your procedure, you may not have a normal bowel movement for a few days.  Please Note:  You might notice some irritation and congestion in your nose or some drainage.  This is from the oxygen used during your procedure.  There is no need for concern and it should clear up in a day or so.  SYMPTOMS TO REPORT IMMEDIATELY:  Following lower endoscopy (colonoscopy or flexible sigmoidoscopy):  Excessive amounts of blood in the stool  Significant tenderness or worsening of abdominal pains  Swelling of the abdomen that is new, acute  Fever of 100F or higher  Following upper endoscopy (EGD)  Vomiting of blood or coffee ground material  New chest pain or pain under the shoulder blades  Painful or persistently difficult swallowing  New shortness of breath  Fever of 100F or higher  Black, tarry-looking stools  For urgent or emergent issues, a gastroenterologist can be reached at any  hour by calling [phone]. Do not use MyChart messaging for urgent concerns.    DIET:  Follow post dilation handout.  Drink plenty of fluids but you should avoid alcoholic beverages for 24 hours.  ACTIVITY:  You should plan to take it easy for the rest of today and you should NOT DRIVE or use heavy machinery until tomorrow (because of the sedation medicines used during the test).    FOLLOW UP: Our staff will call the number listed on your records the next business day following your procedure.  We will call around 7:15- 8:00 am to check on you and address any questions or concerns that you may have regarding the information given to you following your procedure. If we do not reach you, we will leave a message.     If any biopsies were taken you will be contacted by phone or by letter within the next 1-3 weeks.  Please call us at [phone] if you have not heard about the biopsies in 3 weeks.    SIGNATURES/CONFIDENTIALITY: You and/or your care partner have signed paperwork which will be entered into your electronic medical record.  These signatures attest to the fact that that the information above on your After Visit Summary has been reviewed and is understood.  Full responsibility of the confidentiality of this discharge information lies with you and/or your care-partner.

## 2024-01-05 NOTE — Op Note (Signed)
 [facility] Patient Name: Cassandra Osborne Procedure Date: 01/05/2024 2:56 PM MRN: [id number] Endoscopist: Viviann Spare P. Adela Lank , MD, [id number] Age: 58 Referring MD:  Date of Birth: 12/28/1965 Gender: Female Account #: [id number] Procedure:                Upper GI endoscopy Indications:              Dysphagia Medicines:                Monitored Anesthesia Care Procedure:                Pre-Anesthesia Assessment:                           - Prior to the procedure, a History and Physical                            was performed, and patient medications and                            allergies were reviewed. The patient's tolerance of                            previous anesthesia was also reviewed. The risks                            and benefits of the procedure and the sedation                            options and risks were discussed with the patient.                            All questions were answered, and informed consent                            was obtained. Prior Anticoagulants: The patient has                            taken no anticoagulant or antiplatelet agents. ASA                            Grade Assessment: II - A patient with mild systemic                            disease. After reviewing the risks and benefits,                            the patient was deemed in satisfactory condition to                            undergo the procedure.                           After obtaining informed consent, the endoscope was  passed under direct vision. Throughout the                            procedure, the patient's blood pressure, pulse, and                            oxygen saturations were monitored continuously. The                            GIF [id number] #[id number] was introduced through the                            mouth, and advanced to the second part of duodenum.                            The upper GI endoscopy was  accomplished without                            difficulty. The patient tolerated the procedure                            well. Scope In: Scope Out: Findings:                 Esophagogastric landmarks were identified: the                            Z-line was found at 39 cm, the gastroesophageal                            junction was found at 39 cm and the upper extent of                            the gastric folds was found at 40 cm from the                            incisors.                           A 1 cm hiatal hernia was present.                           The exam of the esophagus was otherwise normal.                           A guidewire was placed and the scope was withdrawn.                            Empiric dilation was performed in the entire                            esophagus with a Savary dilator with mild  resistance at 17 mm. Relook endoscopy showed                            dilation effect just distal to the UES. There was                            also some superficial erythema of the distal                            esophagus but no mucosal disruption there.                           The entire examined stomach was normal.                           The examined duodenum was normal. Complications:            No immediate complications. Estimated blood loss:                            Minimal. Estimated Blood Loss:     Estimated blood loss was minimal. Impression:               - Esophagogastric landmarks identified.                           - 1 cm hiatal hernia.                           - Normal esophagus otherwise.                           - Empiric dilation performed, dilation effect noted                            just inferior to UES at site of likely subtle                            stricture there causing symptoms.                           - Normal stomach.                           - Normal examined  duodenum. Recommendation:           - Patient has a contact number available for                            emergencies. The signs and symptoms of potential                            delayed complications were discussed with the                            patient. Return to normal activities tomorrow.  Written discharge instructions were provided to the                            patient.                           - Post dilation diet                           - Continue present medications.                           - Await course post dilation Breslin Burklow P. Danta Baumgardner, MD 01/05/2024 3:39:47 PM This report has been signed electronically.

## 2024-01-05 NOTE — Progress Notes (Signed)
 [facility] History and Physical   Primary Care Physician:  Cassandra Shi, MD   Reason for Procedure:   Dysphagia, history of colon polyps  Plan:    EGD with dilation, colonoscopy     HPI: Cassandra Osborne is a 57 y.o. female  here for EGD and colonoscopy - history of dysphagia, first time EGD. Intermittent dysphagia to solids. Modified barium swallow suggested esophageal dysphagia. History of polyps, last colonoscopy 02/2016 - one adenoma removed.    Patient denies any bowel symptoms at this time. No family history of colon cancer known. Otherwise feels well without any cardiopulmonary symptoms.   I have discussed risks / benefits of anesthesia and endoscopic procedure with Cassandra Osborne and they wish to proceed with the exams as outlined today.    Past Medical History:  Diagnosis Date   COVID    DDD (degenerative disc disease), cervical    GERD (gastroesophageal reflux disease)    High cholesterol    Hypertension    Prediabetes    Sciatica of right side    Tubular adenoma of colon    Vitamin D deficiency     Past Surgical History:  Procedure Laterality Date   LUMBAR LAMINECTOMY/DECOMPRESSION MICRODISCECTOMY  09/18/2012   Procedure: LUMBAR LAMINECTOMY/DECOMPRESSION MICRODISCECTOMY 1 LEVEL;  Surgeon: Cassandra Hurt, MD;  Location: [facility];  Service: Neurosurgery;  Laterality: Left;  Left Lumbar Four-Five Diskectomy    Prior to Admission medications   Medication Sig Start Date End Date Taking? Authorizing Provider  amLODipine (NORVASC) 10 MG tablet Take 1 tablet (10 mg total) by mouth at bedtime. 03/03/23  Yes Cassandra Osborne  simvastatin (ZOCOR) 10 MG tablet Take 1 tablet (10 mg total) by mouth daily. 03/03/23  Yes Cassandra Osborne  baclofen (LIORESAL) 10 MG tablet Take 1 tablet (10 mg total) by mouth 3 (three) times daily. Patient not taking: Reported on 02/26/2023 10/21/21   Cassandra Osborne  cetirizine (ZYRTEC) 10 MG tablet Take 1 tablet (10  mg total) by mouth daily. Patient not taking: Reported on 02/26/2023 06/26/22   Cassandra Bamberg, PA-C  clotrimazole-betamethasone (LOTRISONE) cream Apply 1 application topically 2 (two) times daily. X 2 weeks Patient not taking: Reported on 01/05/2024 01/30/20   Cassandra Osborne  diclofenac Sodium (VOLTAREN) 1 % GEL Apply 4 g topically 4 (four) times daily. Patient not taking: Reported on 01/05/2024 01/30/20   Cassandra Osborne  fluticasone [facility]) 50 MCG/ACT nasal spray Place 2 sprays into both nostrils as needed.    [provider]  ibuprofen (ADVIL) 600 MG tablet Take 1 tablet (600 mg total) by mouth every 6 (six) hours as needed. Patient not taking: Reported on 01/05/2024 10/14/21   Cassandra Kaplan, MD  tiZANidine (ZANAFLEX) 4 MG tablet Take 1 tablet (4 mg total) by mouth at bedtime. Patient not taking: Reported on 02/26/2023 06/26/22   Cassandra Bamberg, PA-C  Vitamin D, Ergocalciferol, (DRISDOL) 1.25 MG (50000 UNIT) CAPS capsule TAKE 1 CAPSULE BY MOUTH EVERY 7 DAYS ON SUNDAY Patient not taking: Reported on 01/05/2024 05/05/20   Cassandra Leyden, Osborne    Current Outpatient Medications  Medication Sig Dispense Refill   amLODipine (NORVASC) 10 MG tablet Take 1 tablet (10 mg total) by mouth at bedtime. 90 tablet 3   simvastatin (ZOCOR) 10 MG tablet Take 1 tablet (10 mg total) by mouth daily. 90 tablet 0   baclofen (LIORESAL) 10 MG tablet Take 1 tablet (10 mg total) by mouth 3 (three) times daily. (Patient not  taking: Reported on 02/26/2023) 30 each 0   cetirizine (ZYRTEC) 10 MG tablet Take 1 tablet (10 mg total) by mouth daily. (Patient not taking: Reported on 02/26/2023) 90 tablet 0   clotrimazole-betamethasone (LOTRISONE) cream Apply 1 application topically 2 (two) times daily. X 2 weeks (Patient not taking: Reported on 01/05/2024) 30 g 0   diclofenac Sodium (VOLTAREN) 1 % GEL Apply 4 g topically 4 (four) times daily. (Patient not taking: Reported on 01/05/2024) 150 g 1   fluticasone (FLONASE) 50 MCG/ACT nasal  spray Place 2 sprays into both nostrils as needed.     ibuprofen (ADVIL) 600 MG tablet Take 1 tablet (600 mg total) by mouth every 6 (six) hours as needed. (Patient not taking: Reported on 01/05/2024) 20 tablet 0   tiZANidine (ZANAFLEX) 4 MG tablet Take 1 tablet (4 mg total) by mouth at bedtime. (Patient not taking: Reported on 02/26/2023) 30 tablet 0   Vitamin D, Ergocalciferol, (DRISDOL) 1.25 MG (50000 UNIT) CAPS capsule TAKE 1 CAPSULE BY MOUTH EVERY 7 DAYS ON SUNDAY (Patient not taking: Reported on 01/05/2024) 5 capsule 0   Current Facility-Administered Medications  Medication Dose Route Frequency Provider Last Rate Last Admin   0.9 %  sodium chloride infusion  500 mL Intravenous Once Cassandra Osborne, Cassandra Rayas, MD        Allergies as of 01/05/2024 - Review Complete 01/05/2024  Allergen Reaction Noted   Sulfa antibiotics Other (See Comments) 08/24/2023   Septra [sulfamethoxazole-trimethoprim] Hives 09/14/2012    Family History  Problem Relation Age of Onset   Stroke Father    Diabetes Father    Hyperlipidemia Father    Hypertension Mother    Cancer Other    Breast cancer Maternal Aunt    Colon cancer Neg Hx    Esophageal cancer Neg Hx    Stomach cancer Neg Hx    Rectal cancer Neg Hx     Social History   Socioeconomic History   Marital status: Married    Spouse name: Not on file   Number of children: Not on file   Years of education: Not on file   Highest education level: Not on file  Occupational History   Not on file  Tobacco Use   Smoking status: Former    Types: Cigarettes   Smokeless tobacco: Never   Tobacco comments:       Vaping Use   Vaping status: Never Used  Substance and Sexual Activity   Alcohol use: No   Drug use: No   Sexual activity: Yes    Birth control/protection: None  Other Topics Concern   Not on file  Social History Narrative   Not on file   Social Drivers of Health   Financial Resource Strain: Not on file  Food Insecurity: Unknown (11/29/2022)    [facility]   Hunger Vital Sign    Within the past 12 months, you worried that your food would run out before you got money to buy more: Not on file    Ran Out of Food in the Last Year: Patient declined to answer  Transportation Needs: Not on file (11/29/2022)  Physical Activity: Not on file  Stress: Not on file  Social Connections: Not on file  Intimate Partner Violence: Not on file    Review of Systems: All other review of systems negative except as mentioned in the HPI.  Physical Exam: Vital signs BP 136/76   Pulse 63   Temp 97.9 F (36.6 C)  Ht 5\' 6"  (1.676 m)   Wt 192 lb (87.1 kg)   LMP 09/27/2014 (Approximate)   SpO2 100%   BMI 30.99 kg/m   General:   Alert,  Well-developed, pleasant and cooperative in NAD Lungs:  Clear throughout to auscultation.   Heart:  Regular rate and rhythm Abdomen:  Soft, nontender and nondistended.   Neuro/Psych:  Alert and cooperative. Normal mood and affect. A and O x 3  Harlin Rain, MD [facility]

## 2024-01-10 ENCOUNTER — Telehealth: Payer: Self-pay

## 2024-01-10 LAB — SURGICAL PATHOLOGY

## 2024-01-10 NOTE — Telephone Encounter (Signed)
Attempted to reach patient for post-procedure f/u call. No answer. No option to leave message.

## 2024-01-13 ENCOUNTER — Encounter: Payer: Self-pay | Admitting: Gastroenterology

## 2024-01-18 ENCOUNTER — Encounter: Payer: Self-pay | Admitting: Gastroenterology

## 2024-02-01 ENCOUNTER — Encounter: Payer: Self-pay | Admitting: Family Medicine

## 2024-02-01 DIAGNOSIS — D369 Benign neoplasm, unspecified site: Secondary | ICD-10-CM | POA: Insufficient documentation

## 2024-02-07 ENCOUNTER — Ambulatory Visit (INDEPENDENT_AMBULATORY_CARE_PROVIDER_SITE_OTHER): Admitting: Orthopaedic Surgery

## 2024-02-07 ENCOUNTER — Other Ambulatory Visit (INDEPENDENT_AMBULATORY_CARE_PROVIDER_SITE_OTHER)

## 2024-02-07 ENCOUNTER — Telehealth: Payer: Self-pay

## 2024-02-07 DIAGNOSIS — M1711 Unilateral primary osteoarthritis, right knee: Secondary | ICD-10-CM

## 2024-02-07 DIAGNOSIS — M1712 Unilateral primary osteoarthritis, left knee: Secondary | ICD-10-CM | POA: Diagnosis not present

## 2024-02-07 NOTE — Progress Notes (Signed)
 Office Visit Note   Patient: Cassandra Osborne           Date of Birth: 17-May-1966           MRN: [id number] Visit Date: 02/07/2024              Requested by: Homer Lust, MD [address] [place],  [place] [address] PCP: Homer Lust, MD   Assessment & Plan: Visit Diagnoses:  1. Primary osteoarthritis of right knee   2. Primary osteoarthritis of left knee     Plan: History of Present Illness Cassandra Osborne is a 58 year old female who presents with bilateral knee pain and weakness.  She experiences diffuse knee pain and weakness, with occasional swelling that alternates between knees and sometimes affects her feet. The sensation of fluid in the knee is present. She has difficulty standing from a seated position without support and has an altered gait. She is unable to fully straighten or bend her knees, impacting her ability to exercise and perform floor stretches. Cortisone injections in her right knee provided temporary relief. Her mother had knee surgeries, and she feels her gait is similar to her mother's.  Physical Exam MUSCULOSKELETAL: Knee extension 0 to >90 degrees bilaterally. Mild crepitus, no effusion in either knee. Ligaments stable bilaterally.  Results RADIOLOGY Knee X-rays: Arthritic changes in knee joints, more severe on the left side, particularly in the patellofemoral compartment  Assessment and Plan Knee osteoarthritis Chronic bilateral knee osteoarthritis with more severe changes in the left knee. Previous cortisone injections provided temporary relief. X-rays confirm arthritic changes. Gel injections proposed as an alternative to cortisone. Reassured her about the safety of synthetic lubricants. - [employer] authorization for gel injections for both knees. - Refer to physical therapy for knee strengthening exercises. - Advise use of knee braces during activity. - Recommend use of Voltaren  gel for pain management. - Discuss weight  management to reduce knee pain.  This patient is diagnosed with osteoarthritis of the knee(s).    Radiographs show evidence of joint space narrowing, osteophytes, subchondral sclerosis and/or subchondral cysts.  This patient has knee pain which interferes with functional and activities of daily living.    This patient has experienced inadequate response, adverse effects and/or intolerance with conservative treatments such as acetaminophen , NSAIDS, topical creams, physical therapy or regular exercise, knee bracing and/or weight loss.   This patient has experienced inadequate response or has a contraindication to intra articular steroid injections for at least 3 months.   This patient is not scheduled to have a total knee replacement within 6 months of starting treatment with viscosupplementation.   Follow-Up Instructions: No follow-ups on file.   Orders:  Orders Placed This Encounter  Procedures   XR KNEE 3 VIEW LEFT   XR KNEE 3 VIEW RIGHT   Ambulatory referral to Physical Therapy   No orders of the defined types were placed in this encounter.   Subjective: Chief Complaint  Patient presents with   Right Knee - Pain   Left Knee - Pain    HPI  Review of Systems  Constitutional: Negative.   HENT: Negative.    Eyes: Negative.   Respiratory: Negative.    Cardiovascular: Negative.   Endocrine: Negative.   Musculoskeletal: Negative.   Neurological: Negative.   Hematological: Negative.   Psychiatric/Behavioral: Negative.    All other systems reviewed and are negative.    Objective: Vital Signs: LMP 09/27/2014 (Approximate)   Physical Exam Vitals and nursing note  reviewed.  Constitutional:      Appearance: She is well-developed.  HENT:     Head: Atraumatic.     Nose: Nose normal.  Eyes:     Extraocular Movements: Extraocular movements intact.  Cardiovascular:     Pulses: Normal pulses.  Pulmonary:     Effort: Pulmonary effort is normal.  Abdominal:      Palpations: Abdomen is soft.  Musculoskeletal:     Cervical back: Neck supple.  Skin:    General: Skin is warm.     Capillary Refill: Capillary refill takes less than 2 seconds.  Neurological:     Mental Status: She is alert. Mental status is at baseline.  Psychiatric:        Behavior: Behavior normal.        Thought Content: Thought content normal.        Judgment: Judgment normal.     Ortho Exam  Specialty Comments:  No specialty comments available.  Imaging: XR KNEE 3 VIEW LEFT Result Date: 02/07/2024 See interpretation of right knee x-rays.  XR KNEE 3 VIEW RIGHT Result Date: 02/07/2024 X-rays of bilateral knees show mild to moderate tricompartmental osteoarthritis with osteophytic spurring.  Minimal joint space narrowing.    PMFS History: Patient Active Problem List   Diagnosis Date Noted   Primary osteoarthritis of right knee 02/07/2024   Primary osteoarthritis of left knee 02/07/2024   Adenomatous polyposis 02/01/2024   1st degree AV block 02/26/2023   Foot pain 11/04/2020   Acute right-sided low back pain without sciatica 07/29/2020   Neck pain 02/04/2020   GERD (gastroesophageal reflux disease) 02/04/2020   Anxiety attack 11/02/2019   Vitamin D  deficiency 09/28/2019   Balance problem 06/15/2019   HTN (hypertension) 10/04/2018   Acute pain of right knee 03/20/2018   Dyspepsia 11/25/2017   Hyperlipidemia 12/19/2015   Prediabetes 12/19/2015   Past Medical History:  Diagnosis Date   COVID    DDD (degenerative disc disease), cervical    GERD (gastroesophageal reflux disease)    High cholesterol    Hypertension    Prediabetes    Sciatica of right side    Tubular adenoma of colon    Vitamin D  deficiency     Family History  Problem Relation Age of Onset   Stroke Father    Diabetes Father    Hyperlipidemia Father    Hypertension Mother    Cancer Other    Breast cancer Maternal Aunt    Colon cancer Neg Hx    Esophageal cancer Neg Hx    Stomach  cancer Neg Hx    Rectal cancer Neg Hx     Past Surgical History:  Procedure Laterality Date   LUMBAR LAMINECTOMY/DECOMPRESSION MICRODISCECTOMY  09/18/2012   Procedure: LUMBAR LAMINECTOMY/DECOMPRESSION MICRODISCECTOMY 1 LEVEL;  Surgeon: Pasty Bongo, MD;  Location: [facility];  Service: Neurosurgery;  Laterality: Left;  Left Lumbar Four-Five Diskectomy   Social History   Occupational History   Not on file  Tobacco Use   Smoking status: Former    Types: Cigarettes   Smokeless tobacco: Never   Tobacco comments:       Vaping Use   Vaping status: Never Used  Substance and Sexual Activity   Alcohol use: No   Drug use: No   Sexual activity: Yes    Birth control/protection: None

## 2024-02-07 NOTE — Telephone Encounter (Signed)
Please precert for bilateral visco injections. Dr.Xu's patient. Thanks!  

## 2024-02-07 NOTE — Progress Notes (Deleted)
 Office Visit Note   Patient: Cassandra Osborne           Date of Birth: 1966-01-15           MRN: [id number] Visit Date: 02/07/2024              Requested by: Homer Lust, MD [address] [place],  [place] [address] PCP: Homer Lust, MD   Assessment & Plan: Visit Diagnoses:  1. Primary osteoarthritis of right knee   2. Primary osteoarthritis of left knee     Plan: History of Present Illness Cassandra Osborne is a 58 year old female who presents with bilateral knee pain and weakness.  She experiences diffuse knee pain and weakness, with occasional swelling that alternates between knees and sometimes affects her feet. The sensation of fluid in the knee is present. She has difficulty standing from a seated position without support and has an altered gait. She is unable to fully straighten or bend her knees, impacting her ability to exercise and perform floor stretches. Cortisone injections in her right knee provided temporary relief. Her mother had knee surgeries, and she feels her gait is similar to her mother's.  Physical Exam MUSCULOSKELETAL: Knee extension 0 to >90 degrees bilaterally. Mild crepitus, no effusion in either knee. Ligaments stable bilaterally.  Results RADIOLOGY Knee X-rays: Arthritic changes in knee joints, more severe on the left side, particularly in the patellofemoral compartment  Assessment and Plan Bilateral knee osteoarthritis Chronic bilateral knee osteoarthritis with more severe changes in the left knee. Previous cortisone injections provided temporary relief. X-rays confirm arthritic changes. Gel injections proposed as an alternative to cortisone. Reassured her about the safety of synthetic lubricants. - [employer] authorization for gel injections for both knees. - Refer to physical therapy for knee strengthening exercises. - Advise use of knee braces during activity. - Recommend use of Voltaren  gel for pain management. - Discuss weight  management to reduce knee pain.  This patient is diagnosed with osteoarthritis of the knee(s).    Radiographs show evidence of joint space narrowing, osteophytes, subchondral sclerosis and/or subchondral cysts.  This patient has knee pain which interferes with functional and activities of daily living.    This patient has experienced inadequate response, adverse effects and/or intolerance with conservative treatments such as acetaminophen , NSAIDS, topical creams, physical therapy or regular exercise, knee bracing and/or weight loss.   This patient has experienced inadequate response or has a contraindication to intra articular steroid injections for at least 3 months.   This patient is not scheduled to have a total knee replacement within 6 months of starting treatment with viscosupplementation.  Follow-Up Instructions: No follow-ups on file.   Orders:  Orders Placed This Encounter  Procedures   XR KNEE 3 VIEW LEFT   XR KNEE 3 VIEW RIGHT   Ambulatory referral to Physical Therapy   No orders of the defined types were placed in this encounter.   Subjective: Chief Complaint  Patient presents with   Right Knee - Pain   Left Knee - Pain    HPI  Review of Systems  Constitutional: Negative.   HENT: Negative.    Eyes: Negative.   Respiratory: Negative.    Cardiovascular: Negative.   Endocrine: Negative.   Musculoskeletal: Negative.   Neurological: Negative.   Hematological: Negative.   Psychiatric/Behavioral: Negative.    All other systems reviewed and are negative.    Objective: Vital Signs: LMP 09/27/2014 (Approximate)   Physical Exam Vitals and nursing note  reviewed.  Constitutional:      Appearance: She is well-developed.  HENT:     Head: Atraumatic.     Nose: Nose normal.  Eyes:     Extraocular Movements: Extraocular movements intact.  Cardiovascular:     Pulses: Normal pulses.  Pulmonary:     Effort: Pulmonary effort is normal.  Abdominal:      Palpations: Abdomen is soft.  Musculoskeletal:     Cervical back: Neck supple.  Skin:    General: Skin is warm.     Capillary Refill: Capillary refill takes less than 2 seconds.  Neurological:     Mental Status: She is alert. Mental status is at baseline.  Psychiatric:        Behavior: Behavior normal.        Thought Content: Thought content normal.        Judgment: Judgment normal.     PMFS History: Patient Active Problem List   Diagnosis Date Noted   Primary osteoarthritis of right knee 02/07/2024   Primary osteoarthritis of left knee 02/07/2024   Adenomatous polyposis 02/01/2024   1st degree AV block 02/26/2023   Foot pain 11/04/2020   Acute right-sided low back pain without sciatica 07/29/2020   Neck pain 02/04/2020   GERD (gastroesophageal reflux disease) 02/04/2020   Anxiety attack 11/02/2019   Vitamin D  deficiency 09/28/2019   Balance problem 06/15/2019   HTN (hypertension) 10/04/2018   Acute pain of right knee 03/20/2018   Dyspepsia 11/25/2017   Hyperlipidemia 12/19/2015   Prediabetes 12/19/2015   Past Medical History:  Diagnosis Date   COVID    DDD (degenerative disc disease), cervical    GERD (gastroesophageal reflux disease)    High cholesterol    Hypertension    Prediabetes    Sciatica of right side    Tubular adenoma of colon    Vitamin D  deficiency     Family History  Problem Relation Age of Onset   Stroke Father    Diabetes Father    Hyperlipidemia Father    Hypertension Mother    Cancer Other    Breast cancer Maternal Aunt    Colon cancer Neg Hx    Esophageal cancer Neg Hx    Stomach cancer Neg Hx    Rectal cancer Neg Hx     Past Surgical History:  Procedure Laterality Date   LUMBAR LAMINECTOMY/DECOMPRESSION MICRODISCECTOMY  09/18/2012   Procedure: LUMBAR LAMINECTOMY/DECOMPRESSION MICRODISCECTOMY 1 LEVEL;  Surgeon: Pasty Bongo, MD;  Location: [facility];  Service: Neurosurgery;  Laterality: Left;  Left Lumbar Four-Five Diskectomy    Social History   Occupational History   Not on file  Tobacco Use   Smoking status: Former    Types: Cigarettes   Smokeless tobacco: Never   Tobacco comments:       Vaping Use   Vaping status: Never Used  Substance and Sexual Activity   Alcohol use: No   Drug use: No   Sexual activity: Yes    Birth control/protection: None

## 2024-02-22 NOTE — Telephone Encounter (Signed)
VOB submitted for Durolane, bilateral knee  

## 2024-02-28 ENCOUNTER — Encounter: Payer: Self-pay | Admitting: Physical Therapy

## 2024-02-28 ENCOUNTER — Ambulatory Visit (INDEPENDENT_AMBULATORY_CARE_PROVIDER_SITE_OTHER): Admitting: Physical Therapy

## 2024-02-28 DIAGNOSIS — M25661 Stiffness of right knee, not elsewhere classified: Secondary | ICD-10-CM | POA: Diagnosis not present

## 2024-02-28 DIAGNOSIS — M6281 Muscle weakness (generalized): Secondary | ICD-10-CM | POA: Diagnosis not present

## 2024-02-28 DIAGNOSIS — M25562 Pain in left knee: Secondary | ICD-10-CM

## 2024-02-28 DIAGNOSIS — M25561 Pain in right knee: Secondary | ICD-10-CM

## 2024-02-28 DIAGNOSIS — R2689 Other abnormalities of gait and mobility: Secondary | ICD-10-CM

## 2024-02-28 DIAGNOSIS — G8929 Other chronic pain: Secondary | ICD-10-CM

## 2024-02-28 DIAGNOSIS — M25662 Stiffness of left knee, not elsewhere classified: Secondary | ICD-10-CM

## 2024-02-28 NOTE — Therapy (Signed)
 OUTPATIENT PHYSICAL THERAPY LOWER EXTREMITY EVALUATION   Patient Name: Cassandra Osborne MRN: [id number] DOB:04-08-1966, 58 y.o., female Today's Date: 02/28/2024  END OF SESSION:  PT End of Session - 02/28/24 0934     Visit Number 1    Number of Visits 10    Date for PT Re-Evaluation 05/10/24    Authorization Type [employer]    Authorization Time Period 60 visit limit,  15% coinsurance    PT Start Time [phone]    PT Stop Time 0930    PT Time Calculation (min) 46 min    Activity Tolerance Patient tolerated treatment well;Patient limited by pain    Behavior During Therapy WFL for tasks assessed/performed             Past Medical History:  Diagnosis Date   COVID    DDD (degenerative disc disease), cervical    GERD (gastroesophageal reflux disease)    High cholesterol    Hypertension    Prediabetes    Sciatica of right side    Tubular adenoma of colon    Vitamin D  deficiency    Past Surgical History:  Procedure Laterality Date   LUMBAR LAMINECTOMY/DECOMPRESSION MICRODISCECTOMY  09/18/2012   Procedure: LUMBAR LAMINECTOMY/DECOMPRESSION MICRODISCECTOMY 1 LEVEL;  Surgeon: Pasty Bongo, MD;  Location: [facility];  Service: Neurosurgery;  Laterality: Left;  Left Lumbar Four-Five Diskectomy   Patient Active Problem List   Diagnosis Date Noted   Primary osteoarthritis of right knee 02/07/2024   Primary osteoarthritis of left knee 02/07/2024   Adenomatous polyposis 02/01/2024   1st degree AV block 02/26/2023   Foot pain 11/04/2020   Acute right-sided low back pain without sciatica 07/29/2020   Neck pain 02/04/2020   GERD (gastroesophageal reflux disease) 02/04/2020   Anxiety attack 11/02/2019   Vitamin D  deficiency 09/28/2019   Balance problem 06/15/2019   HTN (hypertension) 10/04/2018   Acute pain of right knee 03/20/2018   Dyspepsia 11/25/2017   Hyperlipidemia 12/19/2015   Prediabetes 12/19/2015    PCP: Homer Lust, MD  REFERRING PROVIDER: Wes Hamman,  MD  REFERRING DIAG:  17.11 (ICD-10-CM) - Primary osteoarthritis of right knee  M17.12 (ICD-10-CM) - Primary osteoarthritis of left knee   THERAPY DIAG:  Chronic pain of right knee  Chronic pain of left knee  Muscle weakness (generalized)  Stiffness of right knee, not elsewhere classified  Stiffness of left knee, not elsewhere classified  Other abnormalities of gait and mobility  Rationale for Evaluation and Treatment: Rehabilitation  ONSET DATE: 02/07/2024 MD referral to PT  SUBJECTIVE:   SUBJECTIVE STATEMENT: This 58yo patient was seen by Dr. Christiane Cowing on 02/07/2024 for bilateral knee pain.  Exam including X-rays showed OA. She has not responded to Cortisone injections in past. So Dr. Christiane Cowing is going to try PT & Viscosupplementation injections if insurance authorizes.   PERTINENT HISTORY: OA, LBP with sciatica, Left L4-5 diskectomy 2013, 1st degree AV block, HTN, anxiety, balance problems, tubular adenoma of colon, Vit D deficiency  DIAGNOSTIC FINDINGS: 02/07/2024 X-rays Bilateral: X-rays of bilateral knees show mild to moderate tricompartmental osteoarthritis with osteophytic spurring. Minimal joint space narrowing.   PAIN:  Are you having pain? Yes: NPRS scale: at rest right 6/10 left 6/10,  sit to stand, standing & walking up to 9/10 bilateral Pain location: bilateral knees more joint, anterior and down into lower legs Pain description:  dull ache at rest & sharp with movement especially getting up Aggravating factors: standing up,  standing up longer times Relieving factors:  Aleve , ice, knee braces  PRECAUTIONS: None  RED FLAGS: Bowel or bladder incontinence: No   WEIGHT BEARING RESTRICTIONS: No  FALLS:  Has patient fallen in last 6 months? 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 0 1 2 3 4 5 6 7 8 9  10 (Date and Score)   Activity Eval     1.  Standing up from chair 4     2.  Standing at work without pain  5    3.  Stairs  2   4.    5.    Score 3.67    Total score = sum of the activity scores/number of activities Minimum detectable change (90%CI) for average score = 2 points Minimum detectable change (90%CI) for single activity score = 3 points  COGNITION: Overall cognitive status: WFL    SENSATION: WFL  EDEMA:  Non-pitting in bilateral knees & lower legs  MUSCLE LENGTH: Hamstrings: Right -40 deg; Left -50 deg  POSTURE: rounded shoulders, forward head, and flexed trunk   PALPATION: Bilateral knees: tenderness greatest medial anterior lower leg and medial joint line,  less tender to palpation: lateral joint line, patella tendon and distal quad.   LOWER EXTREMITY ROM:   ROM Right eval Left eval  Hip flexion    Hip extension Standing -14* Standing -11*  Hip abduction    Hip adduction    Hip internal rotation    Hip external rotation    Knee flexion    Knee extension LAQ -12* TKE -12* LAQ -9* TKE -7*  Ankle dorsiflexion A: WFL A: WFL  Ankle plantarflexion    Ankle inversion    Ankle eversion     (Blank rows = not tested)  LOWER EXTREMITY MMT:  MMT Right eval Left eval  Hip flexion    Hip extension    Hip abduction    Hip adduction    Hip internal rotation    Hip external rotation    Knee flexion    Knee extension 4/5 4/5  Ankle dorsiflexion 4/5 4/5  Ankle plantarflexion    Ankle inversion    Ankle eversion     (Blank rows = not  tested)  FUNCTIONAL TESTS:  18 inch chair transfer: requires use of armrests 5x STS 25.38  GAIT: Distance walked: >100' Assistive device utilized: None Level of assistance: Modified independence  / but may require some cues in future.  Comments: bilateral knee flexed in stance & trunk flexed entire gait cycle,  wide stance.   TODAY'S TREATMENT  DATE: 02/28/2024: Therapeutic Exercise: HEP instruction/performance c cues for techniques, handout provided.  Trial set performed of each for comprehension and symptom assessment.  See below for exercise list  PATIENT EDUCATION:  Education details: HEP, POC Person educated: Patient Education method: Explanation, Demonstration, Verbal cues, and Handouts Education comprehension: verbalized understanding, returned demonstration, and verbal cues required  HOME EXERCISE PROGRAM: Access Code: [email or web address] URL: [email or web address] Date: 02/28/2024 Prepared by: Lorie Rook  Exercises - supine quad set with towel roll under ankle  - 2-4 x daily - 7 x weekly - 2 sets - 10 reps - 5 seconds hold - Seated Quad Set  - 2-4 x daily - 7 x weekly - 2 sets - 10 reps - 5 seconds hold  ASSESSMENT: CLINICAL IMPRESSION: Patient is a 58 y.o. who comes to clinic with complaints of bilateral knee pain with mobility, strength and movement coordination deficits that impair their ability to perform usual daily and recreational functional activities without increase difficulty/symptoms at this time.  Patient to benefit from skilled PT services to address impairments and limitations to improve to previous level of function without restriction secondary to condition.   OBJECTIVE IMPAIRMENTS: Abnormal gait, decreased activity tolerance, decreased balance, decreased endurance, decreased knowledge of condition, decreased knowledge of use of DME, decreased mobility, difficulty walking,  decreased ROM, decreased strength, increased edema, impaired flexibility, postural dysfunction, obesity, and pain.   ACTIVITY LIMITATIONS: carrying, lifting, bending, sitting, standing, squatting, sleeping, stairs, transfers, and locomotion level  PARTICIPATION LIMITATIONS: meal prep, cleaning, laundry, community activity, and occupation  PERSONAL FACTORS: Fitness, Past/current experiences, and 3+ comorbidities: see PMH are also affecting patient's functional outcome.   REHAB POTENTIAL: Good  CLINICAL DECISION MAKING: Stable/uncomplicated  EVALUATION COMPLEXITY: Low   GOALS: Goals reviewed with patient? Yes  SHORT TERM GOALS: (target date for Short term goals 04/03/2024)   1.  Patient will demonstrate independent use of home exercise program to maintain progress from in clinic treatments. Baseline: See objective data Goal status: Initial   LONG TERM GOALS: (target dates for all long term goals  05/10/2024 )   1. Patient will demonstrate/report pain at worst less than or equal to 2/10 to facilitate minimal limitation in daily activity secondary to pain symptoms. Baseline: See objective data Goal status: Initial   2. Patient will demonstrate independent use of home exercise program to facilitate ability to maintain/progress functional gains from skilled physical therapy services. Baseline: See objective data Goal status: Initial  3.  Patient reports Patient-Specific Activity Score improved the average >7 to indicate improvement in functional activities.  Baseline: SEE OBJECTIVE DATA Goal status: INITIAL   4.  Patient will demonstrate bilateral knee LE MMT 5/5 throughout to faciltiate usual transfers, stairs, squatting at [facility] for daily life.  Baseline: See objective data Goal status: Initial   5.  Patient will demonstrate 5x sit to stand without UE assist <20 sec Baseline: See objective data Goal status: Initial     PLAN:  PT FREQUENCY:  1x/week  PT DURATION: 10  weeks  PLANNED INTERVENTIONS: 97164- PT Re-evaluation, 97750- Physical Performance Testing, 97110-Therapeutic exercises, 97530- Therapeutic activity, [id number]- Neuromuscular re-education, 97535- Self Care, 16109- Manual therapy, [id number]- Gait training, [id number]- Orthotic Initial, [id number]- Orthotic/Prosthetic subsequent, U0454- Electrical stimulation (unattended), [phone]- Electrical stimulation (manual), [id number]- Vasopneumatic device, [id number]- Ultrasound, [id number]- Ionotophoresis 4mg /ml Dexamethasone , Patient/Family education, Balance training, Stair training, Taping, Dry Needling, Joint mobilization, DME instructions, Cryotherapy, and Moist heat  PLAN FOR NEXT SESSION: Review & Update HEP.   Include [employer] exercise recommendations.  Myrlene Riera, PT, DPT 02/28/2024, 9:46 AM

## 2024-03-14 ENCOUNTER — Ambulatory Visit (INDEPENDENT_AMBULATORY_CARE_PROVIDER_SITE_OTHER): Admitting: Physical Therapy

## 2024-03-14 ENCOUNTER — Encounter: Payer: Self-pay | Admitting: Physical Therapy

## 2024-03-14 DIAGNOSIS — M25661 Stiffness of right knee, not elsewhere classified: Secondary | ICD-10-CM | POA: Diagnosis not present

## 2024-03-14 DIAGNOSIS — M6281 Muscle weakness (generalized): Secondary | ICD-10-CM

## 2024-03-14 DIAGNOSIS — M25561 Pain in right knee: Secondary | ICD-10-CM | POA: Diagnosis not present

## 2024-03-14 DIAGNOSIS — M25562 Pain in left knee: Secondary | ICD-10-CM

## 2024-03-14 DIAGNOSIS — G8929 Other chronic pain: Secondary | ICD-10-CM

## 2024-03-14 DIAGNOSIS — R2689 Other abnormalities of gait and mobility: Secondary | ICD-10-CM

## 2024-03-14 DIAGNOSIS — M25662 Stiffness of left knee, not elsewhere classified: Secondary | ICD-10-CM

## 2024-03-14 NOTE — Therapy (Addendum)
 OUTPATIENT PHYSICAL THERAPY LOWER EXTREMITY TREATMENT   Patient Name: Cassandra Osborne MRN: [id number] DOB:[birth year], 58 y.o., female Today's Date: 03/14/2024  PHYSICAL THERAPY DISCHARGE SUMMARY  Visits from Start of Care: 2  Current functional level related to goals / functional outcomes: See below. Unknown progress as patient not return to PT.    Remaining deficits: See below   Education / Equipment: Patient was instructed in HEP which they appeared to understand.    Patient agrees to discharge. Patient goals were Unknown as did not return. Patient is being discharged due to not returning since the last visit.    [name], PT, DPT 06/13/2024, 8:22 AM   END OF SESSION:  PT End of Session - 03/14/24 1512     Visit Number 2    Number of Visits 10    Date for PT Re-Evaluation 05/10/24    Authorization Type [employer]    Authorization Time Period 60 visit limit,  15% coinsurance    PT Start Time 1515    PT Stop Time 1558    PT Time Calculation (min) 43 min    Activity Tolerance Patient tolerated treatment well;Patient limited by pain    Behavior During Therapy WFL for tasks assessed/performed           Past Medical History:  Diagnosis Date   COVID    DDD (degenerative disc disease), cervical    GERD (gastroesophageal reflux disease)    High cholesterol    Hypertension    Prediabetes    Sciatica of right side    Tubular adenoma of colon    Vitamin D  deficiency    Past Surgical History:  Procedure Laterality Date   LUMBAR LAMINECTOMY/DECOMPRESSION MICRODISCECTOMY  09/18/2012   Procedure: LUMBAR LAMINECTOMY/DECOMPRESSION MICRODISCECTOMY 1 LEVEL;  Surgeon: Rockey LITTIE Peru, MD;  Location: [facility];  Service: Neurosurgery;  Laterality: Left;  Left Lumbar Four-Five Diskectomy   Patient Active Problem List   Diagnosis Date Noted   Primary osteoarthritis of right knee 02/07/2024   Primary osteoarthritis of left knee 02/07/2024   Adenomatous polyposis  02/01/2024   1st degree AV block 02/26/2023   Foot pain 11/04/2020   Acute right-sided low back pain without sciatica 07/29/2020   Neck pain 02/04/2020   GERD (gastroesophageal reflux disease) 02/04/2020   Anxiety attack 11/02/2019   Vitamin D  deficiency 09/28/2019   Balance problem 06/15/2019   HTN (hypertension) 10/04/2018   Acute pain of right knee 03/20/2018   Dyspepsia 11/25/2017   Hyperlipidemia 12/19/2015   Prediabetes 12/19/2015    PCP: Toma Matas, MD  REFERRING PROVIDER: Jerri Kay HERO, MD  REFERRING DIAG:  17.11 (ICD-10-CM) - Primary osteoarthritis of right knee  M17.12 (ICD-10-CM) - Primary osteoarthritis of left knee   THERAPY DIAG:  Chronic pain of right knee  Chronic pain of left knee  Muscle weakness (generalized)  Stiffness of right knee, not elsewhere classified  Stiffness of left knee, not elsewhere classified  Other abnormalities of gait and mobility  Rationale for Evaluation and Treatment: Rehabilitation  ONSET DATE: 02/07/2024 MD referral to PT  SUBJECTIVE:   SUBJECTIVE STATEMENT: She goes to [facility] using recumbent stepper, UE wt machines, walks on track 1/2-1 mile.  Currently not using leg machines until PT instructs.   PERTINENT HISTORY: OA, LBP with sciatica, Left L4-5 diskectomy 2013, 1st degree AV block, HTN, anxiety, balance problems, tubular adenoma of colon, Vit D deficiency  DIAGNOSTIC FINDINGS: 02/07/2024 X-rays Bilateral: X-rays of bilateral knees show mild to moderate tricompartmental osteoarthritis with  osteophytic spurring. Minimal joint space narrowing.   PAIN:  Are you having pain? Yes: NPRS scale: at rest right 5/10 left 4/10,  sit to stand, standing & walking up to 9/10 bilateral Pain location: bilateral knees more joint, anterior and down into lower legs Pain description:  dull ache at rest & sharp with movement especially getting up Aggravating factors: standing up,  standing up longer times Relieving factors:  Aleve ,  ice, knee braces  PRECAUTIONS: None  RED FLAGS: Bowel or bladder incontinence: No   WEIGHT BEARING RESTRICTIONS: No  FALLS:  Has patient fallen in last 6 months? 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 0 1 2 3 4 5 6 7 8 9  10 (Date and Score)   Activity Eval     1.  Standing up from chair 4     2.  Standing at work without pain  5    3.  Stairs  2   4.    5.    Score 3.67    Total score = sum of the activity scores/number of activities Minimum detectable change (90%CI) for average score = 2 points Minimum detectable change (90%CI) for single activity score = 3 points  COGNITION: Overall cognitive status: WFL    SENSATION: WFL  EDEMA:  Non-pitting in bilateral knees & lower legs  MUSCLE LENGTH: Hamstrings: Right -40 deg; Left -50 deg  POSTURE: rounded shoulders, forward head, and flexed trunk   PALPATION: Bilateral knees: tenderness greatest medial anterior lower leg and medial joint line,  less tender to palpation: lateral joint line, patella tendon and distal quad.   LOWER EXTREMITY ROM:   ROM Right eval Left eval  Hip flexion    Hip extension Standing -14* Standing -11*  Hip abduction    Hip adduction    Hip internal rotation    Hip external rotation    Knee flexion    Knee extension LAQ -12* TKE -12* LAQ -9* TKE -7*  Ankle dorsiflexion A: WFL A: WFL  Ankle plantarflexion     Ankle inversion    Ankle eversion     (Blank rows = not tested)  LOWER EXTREMITY MMT:  MMT Right eval Left eval  Hip flexion    Hip extension    Hip abduction    Hip adduction    Hip internal rotation    Hip external rotation    Knee flexion    Knee extension 4/5 4/5  Ankle dorsiflexion 4/5 4/5  Ankle plantarflexion    Ankle inversion    Ankle eversion     (Blank rows = not tested)  FUNCTIONAL TESTS:  18 inch chair transfer: requires use of armrests 5x STS 25.38  GAIT: Distance walked: >100' Assistive device utilized: None Level of assistance: Modified independence  / but may require some cues in future.  Comments: bilateral knee flexed in stance & trunk flexed entire gait cycle,  wide stance.  TODAY'S TREATMENT                                                                          DATE: 03/14/2024: Therapeutic Exercise: Nustep seat 9 level 5 with BLEs & BUEs for 8 min. PT educated on benefits to seated reciprocal machines. Pt verbalized understanding.  Knee ext machine BLEs 15# 10 reps;  then concentric BLEs & isometric / eccentric single leg 5# 10 reps ea LE.   Knee flexion machine BLEs 25# 10 reps;  single leg 10# 10 reps on ea LE.   Hamstring stretch single leg long sit with trunk flexion & towel roll under ankle 30 sec hold 2 reps ea LE. Gastroc stretch on step heel depression 30 sec hold 2 reps ea LE. PT updated HEP with HO, demo & verbal cues.  Pt verbalized understanding after performing in clinic.   Therapeutic Activities: Leg press BLEs 100# 15 reps,  single leg with each LE 50# 10 reps 2 sets. PT instructed in form / technique for [employer]     TREATMENT                                                                          DATE: 02/28/2024: Therapeutic Exercise: HEP instruction/performance c cues for techniques, handout provided.  Trial set performed of each for comprehension and symptom assessment.  See below for exercise list  PATIENT EDUCATION:   Education details: HEP, POC Person educated: Patient Education method: Explanation, Demonstration, Verbal cues, and Handouts Education comprehension: verbalized understanding, returned demonstration, and verbal cues required  HOME EXERCISE PROGRAM: Access Code: [email or web address] URL: [email or web address] Date: 03/14/2024 Prepared by: [name]  Exercises - supine quad set with towel roll under ankle  - 2-4 x daily - 7 x weekly - 2 sets - 10 reps - 5 seconds hold - Seated Quad Set  - 2-4 x daily - 7 x weekly - 2 sets - 10 reps - 5 seconds hold - Seated Table Hamstring Stretch  - 1 x daily - 7 x weekly - 1 sets - 3 reps - 30 seconds hold - Gastroc Stretch on Step  - 1 x daily - 7 x weekly - 1 sets - 3 reps - 30 seconds hold  ASSESSMENT: CLINICAL IMPRESSION: PT instructed in LE machines for [employer] and pt appears to understand.  PT also added hamstring & gastroc stretches to HEP which she appears to understand.   OBJECTIVE IMPAIRMENTS: Abnormal gait, decreased activity tolerance, decreased balance, decreased endurance, decreased knowledge of condition, decreased knowledge of use of DME, decreased mobility, difficulty walking, decreased ROM, decreased strength, increased edema, impaired flexibility, postural dysfunction, obesity, and pain.   ACTIVITY LIMITATIONS: carrying, lifting, bending, sitting, standing, squatting, sleeping, stairs, transfers, and locomotion level  PARTICIPATION LIMITATIONS: meal prep, cleaning, laundry, community activity, and occupation  PERSONAL FACTORS: Fitness, Past/current experiences, and 3+ comorbidities: see PMH are also affecting patient's functional outcome.   REHAB POTENTIAL: Good  CLINICAL  DECISION MAKING: Stable/uncomplicated  EVALUATION COMPLEXITY: Low   GOALS: Goals reviewed with patient? Yes  SHORT TERM GOALS: (target date for Short term goals 04/03/2024)   1.  Patient will demonstrate independent use of home exercise program to  maintain progress from in clinic treatments. Baseline: See objective data Goal status: Ongoing  03/14/2024   LONG TERM GOALS: (target dates for all long term goals  05/10/2024 )   1. Patient will demonstrate/report pain at worst less than or equal to 2/10 to facilitate minimal limitation in daily activity secondary to pain symptoms. Baseline: See objective data Goal status:  Ongoing  03/14/2024   2. Patient will demonstrate independent use of home exercise program to facilitate ability to maintain/progress functional gains from skilled physical therapy services. Baseline: See objective data Goal status: Ongoing  03/14/2024  3.  Patient reports Patient-Specific Activity Score improved the average >7 to indicate improvement in functional activities.  Baseline: SEE OBJECTIVE DATA Goal status: Ongoing  03/14/2024   4.  Patient will demonstrate bilateral knee LE MMT 5/5 throughout to faciltiate usual transfers, stairs, squatting at [facility] for daily life.  Baseline: See objective data Goal status: Ongoing  03/14/2024   5.  Patient will demonstrate 5x sit to stand without UE assist <20 sec Baseline: See objective data Goal status: Ongoing  03/14/2024     PLAN:  PT FREQUENCY:  1x/week  PT DURATION: 10 weeks  PLANNED INTERVENTIONS: 97164- PT Re-evaluation, 97750- Physical Performance Testing, 97110-Therapeutic exercises, 97530- Therapeutic activity, [id number]- Neuromuscular re-education, 97535- Self Care, 02859- Manual therapy, [id number]- Gait training, [id number]- Orthotic Initial, [id number]- Orthotic/Prosthetic subsequent, H9716- Electrical stimulation (unattended), [phone]- Electrical stimulation (manual), [id number]- Vasopneumatic device, [id number]- Ultrasound, [id number]- Ionotophoresis 4mg /ml Dexamethasone , Patient/Family education, Balance training, Stair training, Taping, Dry Needling, Joint mobilization, DME instructions, Cryotherapy, and Moist heat  PLAN FOR NEXT SESSION:  add quad stretch & balance activities.     Review & Update HEP.   Include [employer] exercise recommendations.    [name], PT, DPT 03/14/2024, 4:04 PM

## 2024-04-03 ENCOUNTER — Encounter: Admitting: Physical Therapy

## 2024-04-10 ENCOUNTER — Encounter: Admitting: Physical Therapy

## 2024-04-10 NOTE — Therapy (Incomplete)
 OUTPATIENT PHYSICAL THERAPY LOWER EXTREMITY TREATMENT   Patient Name: Cassandra Osborne MRN: [id number] DOB:Apr 17, 1966, 58 y.o., female Today's Date: 04/10/2024  END OF SESSION:     Past Medical History:  Diagnosis Date   COVID    DDD (degenerative disc disease), cervical    GERD (gastroesophageal reflux disease)    High cholesterol    Hypertension    Prediabetes    Sciatica of right side    Tubular adenoma of colon    Vitamin D  deficiency    Past Surgical History:  Procedure Laterality Date   LUMBAR LAMINECTOMY/DECOMPRESSION MICRODISCECTOMY  09/18/2012   Procedure: LUMBAR LAMINECTOMY/DECOMPRESSION MICRODISCECTOMY 1 LEVEL;  Surgeon: Rockey LITTIE Peru, MD;  Location: [facility];  Service: Neurosurgery;  Laterality: Left;  Left Lumbar Four-Five Diskectomy   Patient Active Problem List   Diagnosis Date Noted   Primary osteoarthritis of right knee 02/07/2024   Primary osteoarthritis of left knee 02/07/2024   Adenomatous polyposis 02/01/2024   1st degree AV block 02/26/2023   Foot pain 11/04/2020   Acute right-sided low back pain without sciatica 07/29/2020   Neck pain 02/04/2020   GERD (gastroesophageal reflux disease) 02/04/2020   Anxiety attack 11/02/2019   Vitamin D  deficiency 09/28/2019   Balance problem 06/15/2019   HTN (hypertension) 10/04/2018   Acute pain of right knee 03/20/2018   Dyspepsia 11/25/2017   Hyperlipidemia 12/19/2015   Prediabetes 12/19/2015    PCP: Toma Matas, MD  REFERRING PROVIDER: Jerri Kay HERO, MD  REFERRING DIAG:  17.11 (ICD-10-CM) - Primary osteoarthritis of right knee  M17.12 (ICD-10-CM) - Primary osteoarthritis of left knee   THERAPY DIAG:  No diagnosis found.  Rationale for Evaluation and Treatment: Rehabilitation  ONSET DATE: 02/07/2024 MD referral to PT  SUBJECTIVE:   SUBJECTIVE STATEMENT: ***  She goes to [facility] using recumbent stepper, UE wt machines, walks on track 1/2-1 mile.  Currently not using leg machines until  PT instructs.   PERTINENT HISTORY: OA, LBP with sciatica, Left L4-5 diskectomy 2013, 1st degree AV block, HTN, anxiety, balance problems, tubular adenoma of colon, Vit D deficiency  DIAGNOSTIC FINDINGS: 02/07/2024 X-rays Bilateral: X-rays of bilateral knees show mild to moderate tricompartmental osteoarthritis with osteophytic spurring. Minimal joint space narrowing.   PAIN:  Are you having pain? Yes: NPRS scale: at rest right *** 5/10 left 4/10,  sit to stand, standing & walking up to 9/10 bilateral Pain location: bilateral knees more joint, anterior and down into lower legs Pain description:  dull ache at rest & sharp with movement especially getting up Aggravating factors: standing up,  standing up longer times Relieving factors:  Aleve , ice, knee braces  PRECAUTIONS: None  RED FLAGS: Bowel or bladder incontinence: No   WEIGHT BEARING RESTRICTIONS: No  FALLS:  Has patient fallen in last 6 months? 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 0 1 2 3 4 5 6 7 8 9  10 (Date and  Score)   Activity Eval     1.  Standing up from chair 4     2.  Standing at work without pain  5    3.  Stairs  2   4.    5.    Score 3.67    Total score = sum of the activity scores/number of activities Minimum detectable change (90%CI) for average score = 2 points Minimum detectable change (90%CI) for single activity score = 3  points  COGNITION: Overall cognitive status: WFL    SENSATION: WFL  EDEMA:  Non-pitting in bilateral knees & lower legs  MUSCLE LENGTH: Hamstrings: Right -40 deg; Left -50 deg  POSTURE: rounded shoulders, forward head, and flexed trunk   PALPATION: Bilateral knees: tenderness greatest medial anterior lower leg and medial joint line,  less tender to palpation: lateral joint line, patella tendon and distal quad.   LOWER EXTREMITY ROM:   ROM Right eval Left eval  Hip flexion    Hip extension Standing -14* Standing -11*  Hip abduction    Hip adduction    Hip internal rotation    Hip external rotation    Knee flexion    Knee extension LAQ -12* TKE -12* LAQ -9* TKE -7*  Ankle dorsiflexion A: WFL A: WFL  Ankle plantarflexion    Ankle inversion    Ankle eversion     (Blank rows = not tested)  LOWER EXTREMITY MMT:  MMT Right eval Left eval  Hip flexion    Hip extension    Hip abduction    Hip adduction    Hip internal rotation    Hip external rotation    Knee flexion    Knee extension 4/5 4/5  Ankle dorsiflexion 4/5 4/5  Ankle plantarflexion    Ankle inversion    Ankle eversion     (Blank rows = not tested)  FUNCTIONAL TESTS:  18 inch chair transfer: requires use of armrests 5x STS 25.38  GAIT: Distance walked: >100' Assistive device utilized: None Level of assistance: Modified independence  / but may require some cues in future.  Comments: bilateral knee flexed in stance & trunk flexed entire gait cycle,  wide stance.   TODAY'S TREATMENT                                                                          DATE: 04/10/2024:  *** Therapeutic Exercise: Nustep seat 9 level 5 with BLEs & BUEs for 8 min. PT educated on benefits to seated reciprocal machines. Pt verbalized understanding.  Knee ext machine BLEs 15# 10 reps;  then concentric BLEs & isometric / eccentric single leg 5# 10 reps ea LE.   Knee flexion machine BLEs 25# 10 reps;  single leg 10#  10 reps on ea LE.   Hamstring stretch single leg long sit with trunk flexion & towel roll under ankle 30 sec hold 2 reps ea LE. Gastroc stretch on step heel depression 30 sec hold 2 reps ea LE. PT updated HEP with HO, demo & verbal cues.  Pt verbalized understanding after performing in clinic.   Therapeutic Activities: Leg press BLEs 100# 15 reps,  single leg with each LE 50# 10 reps 2 sets. PT instructed  in form / technique for [employer]    TREATMENT                                                                          DATE: 03/14/2024: Therapeutic Exercise: Nustep seat 9 level 5 with BLEs & BUEs for 8 min. PT educated on benefits to seated reciprocal machines. Pt verbalized understanding.  Knee ext machine BLEs 15# 10 reps;  then concentric BLEs & isometric / eccentric single leg 5# 10 reps ea LE.   Knee flexion machine BLEs 25# 10 reps;  single leg 10# 10 reps on ea LE.   Hamstring stretch single leg long sit with trunk flexion & towel roll under ankle 30 sec hold 2 reps ea LE. Gastroc stretch on step heel depression 30 sec hold 2 reps ea LE. PT updated HEP with HO, demo & verbal cues.  Pt verbalized understanding after performing in clinic.   Therapeutic Activities: Leg press BLEs 100# 15 reps,  single leg with each LE 50# 10 reps 2 sets. PT instructed in form / technique for [employer]     TREATMENT                                                                          DATE: 02/28/2024: Therapeutic Exercise: HEP instruction/performance c cues for techniques, handout provided.  Trial set performed of each for comprehension and symptom assessment.  See below for exercise list  PATIENT EDUCATION:  Education details: HEP, POC Person educated: Patient Education method: Explanation, Demonstration, Verbal cues, and Handouts Education comprehension: verbalized understanding, returned demonstration, and verbal cues required  HOME EXERCISE PROGRAM: Access Code: [email or web address] URL:  [email or web address] Date: 03/14/2024 Prepared by: [name]  Exercises - supine quad set with towel roll under ankle  - 2-4 x daily - 7 x weekly - 2 sets - 10 reps - 5 seconds hold - Seated Quad Set  - 2-4 x daily - 7 x weekly - 2 sets - 10 reps - 5 seconds hold - Seated Table Hamstring Stretch  - 1 x daily - 7 x weekly - 1 sets - 3 reps - 30 seconds hold - Gastroc Stretch on Step  - 1 x daily - 7 x weekly - 1 sets - 3 reps - 30 seconds hold  ASSESSMENT: CLINICAL IMPRESSION: ***  PT instructed in LE machines for [employer] and pt appears to understand.  PT also added hamstring & gastroc stretches to HEP which she appears to understand.   OBJECTIVE IMPAIRMENTS: Abnormal gait, decreased activity tolerance, decreased balance, decreased endurance, decreased knowledge of condition, decreased knowledge of use of DME, decreased mobility, difficulty walking, decreased ROM, decreased strength, increased edema, impaired flexibility, postural dysfunction, obesity, and pain.   ACTIVITY LIMITATIONS: carrying, lifting, bending, sitting, standing, squatting, sleeping, stairs, transfers, and locomotion level  PARTICIPATION LIMITATIONS: meal prep, cleaning, laundry, community activity, and occupation  PERSONAL FACTORS: Fitness, Past/current experiences, and 3+ comorbidities: see PMH are also affecting  patient's functional outcome.   REHAB POTENTIAL: Good  CLINICAL DECISION MAKING: Stable/uncomplicated  EVALUATION COMPLEXITY: Low   GOALS: Goals reviewed with patient? Yes  SHORT TERM GOALS: (target date for Short term goals 04/03/2024)   1.  Patient will demonstrate independent use of home exercise program to maintain progress from in clinic treatments. Baseline: See objective data Goal status: Ongoing  04/10/2024   LONG TERM GOALS: (target dates for all long term goals  05/10/2024 )   1. Patient will demonstrate/report pain at worst less than or equal to 2/10 to facilitate minimal  limitation in daily activity secondary to pain symptoms. Baseline: See objective data Goal status:  Ongoing  03/14/2024   2. Patient will demonstrate independent use of home exercise program to facilitate ability to maintain/progress functional gains from skilled physical therapy services. Baseline: See objective data Goal status: Ongoing  03/14/2024  3.  Patient reports Patient-Specific Activity Score improved the average >7 to indicate improvement in functional activities.  Baseline: SEE OBJECTIVE DATA Goal status: Ongoing  03/14/2024   4.  Patient will demonstrate bilateral knee LE MMT 5/5 throughout to faciltiate usual transfers, stairs, squatting at [facility] for daily life.  Baseline: See objective data Goal status: Ongoing  03/14/2024   5.  Patient will demonstrate 5x sit to stand without UE assist <20 sec Baseline: See objective data Goal status: Ongoing  03/14/2024     PLAN:  PT FREQUENCY:  1x/week  PT DURATION: 10 weeks  PLANNED INTERVENTIONS: 97164- PT Re-evaluation, 97750- Physical Performance Testing, 97110-Therapeutic exercises, 97530- Therapeutic activity, [id number]- Neuromuscular re-education, 97535- Self Care, [id number]- Manual therapy, [id number]- Gait training, [phone]- Orthotic Initial, [id number]- Orthotic/Prosthetic subsequent, H9716- Electrical stimulation (unattended), [phone]- Electrical stimulation (manual), [id number]- Vasopneumatic device, [id number]- Ultrasound, [id number]- Ionotophoresis 4mg /ml Dexamethasone , Patient/Family education, Balance training, Stair training, Taping, Dry Needling, Joint mobilization, DME instructions, Cryotherapy, and Moist heat  PLAN FOR NEXT SESSION: *** add quad stretch & balance activities.    Review & Update HEP.   Include [employer] exercise recommendations.    [name], PT, DPT 04/10/2024, 12:55 PM

## 2024-05-09 ENCOUNTER — Telehealth: Payer: Self-pay

## 2024-05-09 NOTE — Telephone Encounter (Signed)
 Patient said she will call back and was advised to ask for April jackson
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
## 2024-05-24 ENCOUNTER — Ambulatory Visit (INDEPENDENT_AMBULATORY_CARE_PROVIDER_SITE_OTHER)

## 2024-05-24 VITALS — BP 179/87 | HR 51 | Ht 66.0 in | Wt 196.8 lb

## 2024-05-24 DIAGNOSIS — R351 Nocturia: Secondary | ICD-10-CM

## 2024-05-24 DIAGNOSIS — R7303 Prediabetes: Secondary | ICD-10-CM

## 2024-05-24 DIAGNOSIS — E782 Mixed hyperlipidemia: Secondary | ICD-10-CM

## 2024-05-24 DIAGNOSIS — R5383 Other fatigue: Secondary | ICD-10-CM | POA: Diagnosis not present

## 2024-05-24 DIAGNOSIS — I10 Essential (primary) hypertension: Secondary | ICD-10-CM | POA: Diagnosis not present

## 2024-05-24 LAB — POCT GLYCOSYLATED HEMOGLOBIN (HGB A1C): HbA1c, POC (prediabetic range): 6.1 % (ref 5.7–6.4)

## 2024-05-24 NOTE — Assessment & Plan Note (Signed)
 Patient inconsistently taking amlodipine .  Blood pressure 179/87 today.

## 2024-05-24 NOTE — Assessment & Plan Note (Signed)
 Not taking rosuvastatin .  Recheck lipid panel today.

## 2024-05-24 NOTE — Patient Instructions (Signed)
 It was great to see you today!  Today we talked about your reflux, prediabetes, blood pressure, and general health maintenance. We'll be doing a number of labs, so look for a message through the mychart. Please restart your home amlodipine  for now given your elevated BP in clinic today.   Let's see you back in 2 weeks to follow up this visit, and you can ask the front desk to set you up with a visit for a pap smear in the winter.   Thank you for choosing [facility].   Please call [phone] with any questions about today's appointment.  Leafy Scriver, DO Family Medicine

## 2024-05-24 NOTE — Assessment & Plan Note (Signed)
 Point-of-care A1c 6.1 today.  -BMP and urine microalbumin.

## 2024-05-25 LAB — CBC WITH DIFFERENTIAL/PLATELET
Basophils Absolute: 0.1 x10E3/uL (ref 0.0–0.2)
Basos: 1 %
EOS (ABSOLUTE): 0.2 x10E3/uL (ref 0.0–0.4)
Eos: 6 %
Hematocrit: 38.2 % (ref 34.0–46.6)
Hemoglobin: 11.5 g/dL (ref 11.1–15.9)
Immature Grans (Abs): 0 x10E3/uL (ref 0.0–0.1)
Immature Granulocytes: 0 %
Lymphocytes Absolute: 2.1 x10E3/uL (ref 0.7–3.1)
Lymphs: 48 %
MCH: 23.5 pg — ABNORMAL LOW (ref 26.6–33.0)
MCHC: 30.1 g/dL — ABNORMAL LOW (ref 31.5–35.7)
MCV: 78 fL — ABNORMAL LOW (ref 79–97)
Monocytes Absolute: 0.4 x10E3/uL (ref 0.1–0.9)
Monocytes: 8 %
Neutrophils Absolute: 1.6 x10E3/uL (ref 1.4–7.0)
Neutrophils: 37 %
Platelets: 244 x10E3/uL (ref 150–450)
RBC: 4.9 x10E6/uL (ref 3.77–5.28)
RDW: 15.5 % — ABNORMAL HIGH (ref 11.7–15.4)
WBC: 4.3 x10E3/uL (ref 3.4–10.8)

## 2024-05-25 LAB — BASIC METABOLIC PANEL WITH GFR
BUN/Creatinine Ratio: 14 (ref 12–28)
BUN: 11 mg/dL (ref 8–27)
CO2: 23 mmol/L (ref 20–29)
Calcium: 9.5 mg/dL (ref 8.7–10.3)
Chloride: 105 mmol/L (ref 96–106)
Creatinine, Ser: 0.78 mg/dL (ref 0.57–1.00)
Glucose: 79 mg/dL (ref 70–99)
Potassium: 4.2 mmol/L (ref 3.5–5.2)
Sodium: 140 mmol/L (ref 134–144)
eGFR: 87 mL/min/1.73 (ref >59)

## 2024-05-25 LAB — LIPID PANEL
Chol/HDL Ratio: 4.7 ratio — ABNORMAL HIGH (ref 0.0–4.4)
Cholesterol, Total: 234 mg/dL — ABNORMAL HIGH (ref 100–199)
HDL: 50 mg/dL (ref >39)
LDL Chol Calc (NIH): 172 mg/dL — ABNORMAL HIGH (ref 0–99)
Triglycerides: 71 mg/dL (ref 0–149)
VLDL Cholesterol Cal: 12 mg/dL (ref 5–40)

## 2024-05-25 LAB — TSH: TSH: 1.33 u[IU]/mL (ref 0.450–4.500)

## 2024-05-25 LAB — MICROALBUMIN / CREATININE URINE RATIO
Creatinine, Urine: 114.2 mg/dL
Microalb/Creat Ratio: 7 mg/g{creat} (ref 0–29)
Microalbumin, Urine: 7.5 ug/mL

## 2024-05-25 LAB — VITAMIN D 25 HYDROXY (VIT D DEFICIENCY, FRACTURES): Vit D, 25-Hydroxy: 13.5 ng/mL — ABNORMAL LOW (ref 30.0–100.0)

## 2024-05-26 ENCOUNTER — Ambulatory Visit: Payer: Self-pay

## 2024-05-26 DIAGNOSIS — I10 Essential (primary) hypertension: Secondary | ICD-10-CM

## 2024-05-26 DIAGNOSIS — E782 Mixed hyperlipidemia: Secondary | ICD-10-CM

## 2024-05-26 DIAGNOSIS — E559 Vitamin D deficiency, unspecified: Secondary | ICD-10-CM

## 2024-05-26 MED ORDER — CHOLECALCIFEROL 1.25 MG (50000 UT) PO TABS: 1.0000 | ORAL_TABLET | ORAL | 0 refills | Status: DC

## 2024-05-26 MED ORDER — ROSUVASTATIN CALCIUM 20 MG PO TABS: 20.0000 mg | ORAL_TABLET | Freq: Every day | ORAL | 3 refills | Status: DC

## 2024-05-29 ENCOUNTER — Encounter: Admitting: Family Medicine

## 2024-06-01 MED ORDER — SIMVASTATIN 10 MG PO TABS: 10.0000 mg | ORAL_TABLET | Freq: Every day | ORAL | 3 refills | Status: DC

## 2024-06-01 MED ORDER — AMLODIPINE BESYLATE 10 MG PO TABS: 10.0000 mg | ORAL_TABLET | Freq: Every day | ORAL | 3 refills | Status: AC

## 2024-06-01 MED ORDER — SIMVASTATIN 20 MG PO TABS: 20.0000 mg | ORAL_TABLET | Freq: Every day | ORAL | 3 refills | Status: DC

## 2024-06-01 MED ORDER — VITAMIN D (ERGOCALCIFEROL) 1.25 MG (50000 UNIT) PO CAPS: 50000.0000 [IU] | ORAL_CAPSULE | ORAL | 0 refills | Status: AC

## 2024-07-05 NOTE — Addendum Note (Signed)
 Addended by: MANON JESTER on: 07/05/2024 11:19 AM   Modules accepted: Orders

## 2024-07-15 ENCOUNTER — Ambulatory Visit
Admission: EM | Admit: 2024-07-15 | Discharge: 2024-07-15 | Disposition: A | Discharge status: Home / Self Care | Attending: Family Medicine | Admitting: Family Medicine

## 2024-07-15 DIAGNOSIS — J329 Chronic sinusitis, unspecified: Secondary | ICD-10-CM | POA: Diagnosis not present

## 2024-07-15 DIAGNOSIS — J40 Bronchitis, not specified as acute or chronic: Secondary | ICD-10-CM | POA: Diagnosis not present

## 2024-07-15 MED ORDER — PROMETHAZINE-DM 6.25-15 MG/5ML PO SYRP: 5.0000 mL | ORAL_SOLUTION | Freq: Three times a day (TID) | ORAL | 0 refills | Status: AC | PRN

## 2024-07-15 MED ORDER — AMOXICILLIN-POT CLAVULANATE 875-125 MG PO TABS: 1.0000 | ORAL_TABLET | Freq: Two times a day (BID) | ORAL | 0 refills | Status: DC

## 2024-07-15 NOTE — Discharge Instructions (Addendum)
 Start Augmentin antibiotic twice daily for 7 days.  May take Promethazine  DM as needed for your cough.  Please note this medication will make you drowsy.  Do not drink alcohol or drive on this medication.  Lots of rest and fluids.  Please follow-up with your PCP if your symptoms do not improve.  Please go to the ER for any worsening symptoms.  Hope you feel better soon!

## 2024-07-15 NOTE — ED Provider Notes (Addendum)
 [facility]    CSN: [id number] Arrival date & time: 07/15/24  9164      History   Chief Complaint No chief complaint on file.   HPI Cassandra Osborne is a 58 y.o. female  presents for evaluation of URI symptoms for 7 days. Patient reports associated symptoms of cough, congestion, ear pain, sinus pressure, nausea,ST.  Dates that hurts initially but those have since resolved.  Denies V/D, body aches or shortness of breath. Patient does not have a hx of asthma. Patient is no not an active smoker.   Reports no known sick contacts.  Pt has taken musinex OTC for symptoms. Pt has no other concerns at this time.   HPI  Past Medical History:  Diagnosis Date   COVID    DDD (degenerative disc disease), cervical    GERD (gastroesophageal reflux disease)    High cholesterol    Hypertension    Prediabetes    Sciatica of right side    Tubular adenoma of colon    Vitamin D  deficiency     Patient Active Problem List   Diagnosis Date Noted   Primary osteoarthritis of right knee 02/07/2024   Primary osteoarthritis of left knee 02/07/2024   Adenomatous polyposis 02/01/2024   1st degree AV block 02/26/2023   Foot pain 11/04/2020   Acute right-sided low back pain without sciatica 07/29/2020   Neck pain 02/04/2020   GERD (gastroesophageal reflux disease) 02/04/2020   Anxiety attack 11/02/2019   Vitamin D  deficiency 09/28/2019   Balance problem 06/15/2019   HTN (hypertension) 10/04/2018   Acute pain of right knee 03/20/2018   Dyspepsia 11/25/2017   Hyperlipidemia 12/19/2015   Prediabetes 12/19/2015    Past Surgical History:  Procedure Laterality Date   LUMBAR LAMINECTOMY/DECOMPRESSION MICRODISCECTOMY  09/18/2012   Procedure: LUMBAR LAMINECTOMY/DECOMPRESSION MICRODISCECTOMY 1 LEVEL;  Surgeon: Rockey LITTIE Peru, MD;  Location: [facility];  Service: Neurosurgery;  Laterality: Left;  Left Lumbar Four-Five Diskectomy    OB History     Gravida  2   Para  2   Term  2    Preterm      AB      Living  2      SAB      IAB      Ectopic      Multiple      Live Births  2            Home Medications    Prior to Admission medications   Medication Sig Start Date End Date Taking? Authorizing Provider  amoxicillin-clavulanate (AUGMENTIN) 875-125 MG tablet Take 1 tablet by mouth every 12 (twelve) hours. 07/15/24  Yes Cassandra Osborne, Cassandra Osborne, Cassandra Osborne  promethazine -dextromethorphan (PROMETHAZINE -DM) 6.25-15 MG/5ML syrup Take 5 mLs by mouth 3 (three) times daily as needed for cough. 07/15/24  Yes Cassandra Osborne, Cassandra Osborne, Cassandra Osborne  amLODipine  (NORVASC ) 10 MG tablet Take 1 tablet (10 mg total) by mouth at bedtime. 06/01/24   Shitarev, Dimitry, MD  clotrimazole -betamethasone  (LOTRISONE ) cream Apply 1 application topically 2 (two) times daily. X 2 weeks Patient not taking: Reported on 11/24/2023 01/30/20   Essense, [place], DO  diclofenac  Sodium (VOLTAREN ) 1 % GEL Apply 4 g topically 4 (four) times daily. Patient not taking: Reported on 11/24/2023 01/30/20   Arnesia, [place], DO  fluticasone (FLONASE) 50 MCG/ACT nasal spray Place 2 sprays into both nostrils as needed.    [provider]  ibuprofen  (ADVIL ) 600 MG tablet Take 1 tablet (600 mg total) by mouth  every 6 (six) hours as needed. Patient not taking: Reported on 11/24/2023 10/14/21   Charlyn Sora, MD  simvastatin  (ZOCOR ) 10 MG tablet Take 1 tablet (10 mg total) by mouth daily. 06/01/24   Shitarev, Dimitry, MD  Vitamin D , Ergocalciferol , (DRISDOL ) 1.25 MG (50000 UNIT) CAPS capsule Take 1 capsule (50,000 Units total) by mouth every 7 (seven) days. 06/01/24   Shitarev, Dimitry, MD    Family History Family History  Problem Relation Age of Onset   Stroke Father    Diabetes Father    Hyperlipidemia Father    Hypertension Mother    Cancer Other    Breast cancer Maternal Aunt    Colon cancer Neg Hx    Esophageal cancer Neg Hx    Stomach cancer Neg Hx    Rectal cancer Neg Hx     Social History Social History   Tobacco Use    Smoking status: Former    Types: Cigarettes   Smokeless tobacco: Never   Tobacco comments:       Vaping Use   Vaping status: Never Used  Substance Use Topics   Alcohol use: No   Drug use: No     Allergies   Sulfa antibiotics and Septra [sulfamethoxazole-trimethoprim]   Review of Systems Review of Systems  HENT:  Positive for congestion, ear pain, sinus pressure, sinus pain and sore throat.   Respiratory:  Positive for cough.   Gastrointestinal:  Positive for nausea.     Physical Exam Triage Vital Signs ED Triage Vitals  Encounter Vitals Group     BP 07/15/24 0844 131/80     Girls Systolic BP Percentile --      Girls Diastolic BP Percentile --      Boys Systolic BP Percentile --      Boys Diastolic BP Percentile --      Pulse Rate 07/15/24 0844 85     Resp 07/15/24 0844 18     Temp 07/15/24 0844 98.8 F (37.1 C)     Temp Source 07/15/24 0844 Oral     SpO2 07/15/24 0844 98 %     Weight --      Height --      Head Circumference --      Peak Flow --      Pain Score 07/15/24 0843 0     Pain Loc --      Pain Education --      Exclude from Growth Chart --    No data found.  Updated Vital Signs BP 131/80 (BP Location: Right Arm)   Pulse 85   Temp 98.8 F (37.1 C) (Oral)   Resp 18   LMP 09/27/2014 (Approximate)   SpO2 98%   Visual Acuity Right Eye Distance:   Left Eye Distance:   Bilateral Distance:    Right Eye Near:   Left Eye Near:    Bilateral Near:     Physical Exam Vitals and nursing note reviewed.  Constitutional:      General: She is not in acute distress.    Appearance: She is well-developed. She is not ill-appearing.  HENT:     Head: Normocephalic and atraumatic.     Right Ear: Tympanic membrane and ear canal normal.     Left Ear: Tympanic membrane and ear canal normal.     Nose: Congestion present.     Right Turbinates: Swollen and pale.     Left Turbinates: Swollen and pale.     Right Sinus: Maxillary sinus tenderness  present.      Left Sinus: Maxillary sinus tenderness present.     Mouth/Throat:     Mouth: Mucous membranes are moist.     Pharynx: Oropharynx is clear. Uvula midline. No oropharyngeal exudate or posterior oropharyngeal erythema.     Tonsils: No tonsillar exudate or tonsillar abscesses.  Eyes:     Conjunctiva/sclera: Conjunctivae normal.     Pupils: Pupils are equal, round, and reactive to light.  Cardiovascular:     Rate and Rhythm: Normal rate and regular rhythm.     Heart sounds: Normal heart sounds.  Pulmonary:     Effort: Pulmonary effort is normal.     Breath sounds: Normal breath sounds. No wheezing or rhonchi.  Musculoskeletal:     Cervical back: Normal range of motion and neck supple.  Lymphadenopathy:     Cervical: No cervical adenopathy.  Skin:    General: Skin is warm and dry.  Neurological:     General: No focal deficit present.     Mental Status: She is alert and oriented to person, place, and time.  Psychiatric:        Mood and Affect: Mood normal.        Behavior: Behavior normal.      UC Treatments / Results  Labs (all labs ordered are listed, but only abnormal results are displayed) Labs Reviewed - No data to display  Basic Metabolic Panel Order: [id number]  Status: Final result     Next appt: None     Dx: Primary hypertension; Prediabetes   Test Result Released: Yes (seen)     Messages: Seen   0 Result Notes     1 Patient Communication     View Follow-Up Encounter          Component Ref Range & Units (hover) 1 mo ago (05/24/24) 9 mo ago (09/28/23) 1 yr ago (03/03/23) 2 yr ago (10/21/21) 3 yr ago (07/16/21) 4 yr ago (01/30/20) 4 yr ago (09/26/19)  Glucose 79 107 High  CM 92 100 High  81 72 Osborne 109 High  Osborne  BUN 11 15 Osborne 10 Osborne 10 Osborne 14 Osborne 14 Osborne 11 Osborne  Creatinine, Ser 0.78 0.70 Osborne 0.67 0.73 0.76 0.71 0.83  eGFR 87  101 96 91    BUN/Creatinine Ratio 14  15 Osborne 14 Osborne 18 Osborne 20 Osborne 13 Osborne  Sodium 140 142 Osborne 142 143 146 High  142 144  Potassium 4.2 3.8 Osborne 3.9 4.3 4.2 4.1 4.5   Chloride 105 108 Osborne 107 High  107 High  107 High  106 107 High   CO2 23 26 Osborne 23 24 24 22 22   Calcium  9.5 9.6 Osborne 9.6 Osborne 9.7 Osborne 9.8 Osborne 9.8 Osborne 9.9 Osborne  Resulting Agency [facility]         Narrative Performed by: HOYT Performed at:  [address] [employer] [address], [place], [place]  [id number] Lab Director: Frankey Sas MD, Phone:  [phone]  Specimen Collected: 05/24/24 15:46 Last Resulted: 05/25/24 08:09    EKG   Radiology No results found.  Procedures Procedures (including critical care time)  Medications Ordered in UC Medications - No data to display  Initial Impression / Assessment and Plan / UC Course  I have reviewed the triage vital signs and the nursing notes.  Pertinent labs & imaging results that were available during my care of the patient were reviewed by me and considered in  my medical decision making (see chart for details).     Reviewed exam and symptoms with patient.  No red flags.  Start Augmentin twice daily for 7 days.  Promethazine  DM as needed for cough, side effect profile reviewed.  Patient declined Flonase.  Encourage rest fluids and PCP follow-up if symptoms do not improve.  ER precautions reviewed Final Clinical Impressions(s) / UC Diagnoses   Final diagnoses:  Sinobronchitis     Discharge Instructions      Start Augmentin antibiotic twice daily for 7 days.  May take Promethazine  DM as needed for your cough.  Please note this medication will make you drowsy.  Do not drink alcohol or drive on this medication.  Lots of rest and fluids.  Please follow-up with your PCP if your symptoms do not improve.  Please go to the ER for any worsening symptoms.  Hope you feel better soon!    ED Prescriptions     Medication Sig Dispense Auth. Provider   amoxicillin-clavulanate (AUGMENTIN) 875-125 MG tablet Take 1 tablet by mouth every 12 (twelve) hours. 14 tablet Cassandra Osborne, Cassandra Osborne, Cassandra Osborne    promethazine -dextromethorphan (PROMETHAZINE -DM) 6.25-15 MG/5ML syrup Take 5 mLs by mouth 3 (three) times daily as needed for cough. 118 mL Cassandra Osborne, Cassandra Osborne, Cassandra Osborne      PDMP not reviewed this encounter.   Loreda Myla SAUNDERS, Cassandra Osborne 07/15/24 0901    Loreda Myla SAUNDERS, Cassandra Osborne 07/15/24 [phone]

## 2024-07-15 NOTE — ED Triage Notes (Signed)
 Pt reports ear pain, nose pain/swelling, cough chest congestion, nasal congestion x 1 week. Pt tried Mucinex yesterday without relief.

## 2024-07-16 ENCOUNTER — Telehealth: Payer: Self-pay

## 2024-07-16 ENCOUNTER — Telehealth: Payer: Self-pay | Admitting: Urgent Care

## 2024-07-16 MED ORDER — AMOXICILLIN 875 MG PO TABS: 875.0000 mg | ORAL_TABLET | Freq: Two times a day (BID) | ORAL | 0 refills | Status: AC

## 2024-07-16 NOTE — Telephone Encounter (Signed)
 Pt wants to change Amoxicillin-Pot Clavulanate to amoxicillin, as she said Amoxicillin-Pot Clavulanate dry her nose and had 1 nosebleed this morning

## 2024-07-16 NOTE — Telephone Encounter (Signed)
 Patient called requesting prescription be changed from Augmentin to amoxicillin.  I honored this request.

## 2024-07-30 ENCOUNTER — Encounter: Payer: Self-pay | Admitting: Radiology

## 2024-08-02 ENCOUNTER — Emergency Department (HOSPITAL_BASED_OUTPATIENT_CLINIC_OR_DEPARTMENT_OTHER): Admitting: Radiology

## 2024-08-02 ENCOUNTER — Emergency Department (HOSPITAL_BASED_OUTPATIENT_CLINIC_OR_DEPARTMENT_OTHER)
Admission: EM | Admit: 2024-08-02 | Discharge: 2024-08-02 | Disposition: A | Discharge status: Home / Self Care | Attending: Emergency Medicine | Admitting: Emergency Medicine

## 2024-08-02 ENCOUNTER — Other Ambulatory Visit: Payer: Self-pay

## 2024-08-02 ENCOUNTER — Encounter (HOSPITAL_BASED_OUTPATIENT_CLINIC_OR_DEPARTMENT_OTHER): Payer: Self-pay | Admitting: Emergency Medicine

## 2024-08-02 DIAGNOSIS — J069 Acute upper respiratory infection, unspecified: Secondary | ICD-10-CM | POA: Diagnosis not present

## 2024-08-02 DIAGNOSIS — R059 Cough, unspecified: Secondary | ICD-10-CM | POA: Diagnosis present

## 2024-08-02 LAB — CBC WITH DIFFERENTIAL/PLATELET
Abs Immature Granulocytes: 0.01 K/uL (ref 0.00–0.07)
Basophils Absolute: 0 K/uL (ref 0.0–0.1)
Basophils Relative: 1 %
Eosinophils Absolute: 0.2 K/uL (ref 0.0–0.5)
Eosinophils Relative: 3 %
HCT: 32.8 % — ABNORMAL LOW (ref 36.0–46.0)
Hemoglobin: 10.2 g/dL — ABNORMAL LOW (ref 12.0–15.0)
Immature Granulocytes: 0 %
Lymphocytes Relative: 21 %
Lymphs Abs: 1.4 K/uL (ref 0.7–4.0)
MCH: 23.1 pg — ABNORMAL LOW (ref 26.0–34.0)
MCHC: 31.1 g/dL (ref 30.0–36.0)
MCV: 74.2 fL — ABNORMAL LOW (ref 80.0–100.0)
Monocytes Absolute: 0.4 K/uL (ref 0.1–1.0)
Monocytes Relative: 7 %
Neutro Abs: 4.6 K/uL (ref 1.7–7.7)
Neutrophils Relative %: 68 %
Platelets: 293 K/uL (ref 150–400)
RBC: 4.42 MIL/uL (ref 3.87–5.11)
RDW: 16.7 % — ABNORMAL HIGH (ref 11.5–15.5)
WBC: 6.6 K/uL (ref 4.0–10.5)
nRBC: 0 % (ref 0.0–0.2)

## 2024-08-02 LAB — BASIC METABOLIC PANEL WITH GFR
Anion gap: 10 (ref 5–15)
BUN: 9 mg/dL (ref 6–20)
CO2: 23 mmol/L (ref 22–32)
Calcium: 9.5 mg/dL (ref 8.9–10.3)
Chloride: 106 mmol/L (ref 98–111)
Creatinine, Ser: 0.72 mg/dL (ref 0.44–1.00)
GFR, Estimated: >60 mL/min (ref >60)
Glucose, Bld: 103 mg/dL — ABNORMAL HIGH (ref 70–99)
Potassium: 3.7 mmol/L (ref 3.5–5.1)
Sodium: 140 mmol/L (ref 135–145)

## 2024-08-02 NOTE — ED Triage Notes (Signed)
 Pt in with continued sinus congestion/cough and sinus pressure lingering for over a month. Pt has seen 3 doctors concerning the ongoing symptoms, feels no relief. Was just prescribed Prednisone  and Levofloxacin a few days ago.

## 2024-08-02 NOTE — ED Provider Notes (Signed)
 [place] [facility]  Provider Note  CSN: [id number] Arrival date & time: 08/02/24 0351  History Chief Complaint  Patient presents with   Cough    Cassandra Osborne is a 58 y.o. female here with nasal congestion, post-nasal drip and cough ongoing for several weeks. She has been to PCP/UC and ENT and completed courses of amoxil and cefdinir without improvement. She is using Afrin frequently at night to help with congestion. She reports she wakes up with a choking feeling and it made her scared tonight prompting an ED visit. She has been using a humidifier in her room. She called ENT due to continued symptoms and they sent Rx for Levaquin and Medrol  which she picked up yesterday but is waiting until AM to start. No fever.    Home Medications Prior to Admission medications   Medication Sig Start Date End Date Taking? Authorizing Provider  amLODipine  (NORVASC ) 10 MG tablet Take 1 tablet (10 mg total) by mouth at bedtime. 06/01/24   Shitarev, Dimitry, MD  amoxicillin (AMOXIL) 875 MG tablet Take 1 tablet (875 mg total) by mouth 2 (two) times daily. 07/16/24   Christopher Savannah, PA-C  fluticasone (FLONASE) 50 MCG/ACT nasal spray Place 2 sprays into both nostrils as needed.    [provider]  promethazine -dextromethorphan (PROMETHAZINE -DM) 6.25-15 MG/5ML syrup Take 5 mLs by mouth 3 (three) times daily as needed for cough. 07/15/24   Mayer, Jodi R, NP  simvastatin  (ZOCOR ) 10 MG tablet Take 1 tablet (10 mg total) by mouth daily. 06/01/24   Shitarev, Dimitry, MD  Vitamin D , Ergocalciferol , (DRISDOL ) 1.25 MG (50000 UNIT) CAPS capsule Take 1 capsule (50,000 Units total) by mouth every 7 (seven) days. 06/01/24   Shitarev, Dimitry, MD     Allergies    Sulfa antibiotics and Septra [sulfamethoxazole-trimethoprim]   Review of Systems   Review of Systems Please see HPI for pertinent positives and negatives  Physical Exam BP (!) 141/80   Pulse 69   Temp 98.4 F  (36.9 C) (Oral)   Resp 18   Wt 89.3 kg   LMP 09/27/2014 (Approximate)   SpO2 99%   BMI 31.78 kg/m   Physical Exam Vitals and nursing note reviewed.  Constitutional:      Appearance: Normal appearance.  HENT:     Head: Normocephalic and atraumatic.     Nose: Congestion and rhinorrhea present.     Mouth/Throat:     Mouth: Mucous membranes are moist.  Eyes:     Extraocular Movements: Extraocular movements intact.     Conjunctiva/sclera: Conjunctivae normal.  Cardiovascular:     Rate and Rhythm: Normal rate.  Pulmonary:     Effort: Pulmonary effort is normal.     Breath sounds: Normal breath sounds. No wheezing or rales.  Abdominal:     General: Abdomen is flat.     Palpations: Abdomen is soft.     Tenderness: There is no abdominal tenderness.  Musculoskeletal:        General: No swelling. Normal range of motion.     Cervical back: Neck supple.  Skin:    General: Skin is warm and dry.  Neurological:     General: No focal deficit present.     Mental Status: She is alert.  Psychiatric:        Mood and Affect: Mood normal.     ED Results / Procedures / Treatments   EKG None  Procedures Procedures  Medications Ordered in the ED Medications -  No data to display  Initial Impression and Plan  Patient here with symptoms of likely viral URI hence the limited response to oral Abx. She has normal vitals and a reassuring exam tonight. She is concerned there is something more serious wrong with her and requests labs and CXR.   ED Course   Clinical Course as of 08/02/24 0523  Thu Aug 02, 2024  9563 Patient now does not want CXR, but does want to proceed with labs to 'check her levels'.  [CS]  0509 CBC with mild anemia, history of same.  [CS]  0521 BMP is unremarkable. Patient reassured no concerning findings. Recommend she continue with supportive care, begin Abx and steroids as prescribed. PCP follow up, RTED for any other concerns.   [CS]    Clinical Course User  Index [CS] Roselyn Carlin NOVAK, MD     MDM Rules/Calculators/A&P Medical Decision Making Problems Addressed: Upper respiratory tract infection, unspecified type: acute illness or injury  Amount and/or Complexity of Data Reviewed Labs: ordered. Decision-making details documented in ED Course.  Risk Prescription drug management.     Final Clinical Impression(s) / ED Diagnoses Final diagnoses:  Upper respiratory tract infection, unspecified type    Rx / DC Orders ED Discharge Orders     None        Roselyn Carlin NOVAK, MD 08/02/24 [phone]

## 2024-09-04 ENCOUNTER — Other Ambulatory Visit: Payer: Self-pay | Admitting: Family Medicine

## 2024-09-04 DIAGNOSIS — E782 Mixed hyperlipidemia: Secondary | ICD-10-CM
